# Patient Record
Sex: Female | Born: 1965 | Race: Black or African American | Hispanic: No | Marital: Married | Smoking: Never smoker
Health system: Southern US, Community
[De-identification: ages and names within clinical notes are randomized; demographics above are authoritative.]

## PROBLEM LIST (undated history)

## (undated) DIAGNOSIS — E876 Hypokalemia: Secondary | ICD-10-CM

## (undated) DIAGNOSIS — A419 Sepsis, unspecified organism: Secondary | ICD-10-CM

## (undated) DIAGNOSIS — H547 Unspecified visual loss: Secondary | ICD-10-CM

## (undated) DIAGNOSIS — M797 Fibromyalgia: Secondary | ICD-10-CM

## (undated) DIAGNOSIS — I509 Heart failure, unspecified: Secondary | ICD-10-CM

## (undated) DIAGNOSIS — M729 Fibroblastic disorder, unspecified: Secondary | ICD-10-CM

## (undated) DIAGNOSIS — L0390 Cellulitis, unspecified: Secondary | ICD-10-CM

## (undated) DIAGNOSIS — I10 Essential (primary) hypertension: Secondary | ICD-10-CM

## (undated) HISTORY — DX: Cellulitis, unspecified: L03.90

## (undated) HISTORY — DX: Hypokalemia: E87.6

## (undated) HISTORY — DX: Fibroblastic disorder, unspecified: M72.9

## (undated) HISTORY — DX: Sepsis, unspecified organism: A41.9

## (undated) HISTORY — DX: Heart failure, unspecified: I50.9

## (undated) HISTORY — DX: Fibromyalgia: M79.7

---

## 2013-02-03 ENCOUNTER — Emergency Department (HOSPITAL_COMMUNITY)
Admission: EM | Admit: 2013-02-03 | Discharge: 2013-02-04 | Disposition: A | Discharge status: Home / Self Care | Payer: Medicare Other | Attending: Emergency Medicine | Admitting: Emergency Medicine

## 2013-02-03 ENCOUNTER — Encounter (HOSPITAL_COMMUNITY): Payer: Self-pay | Admitting: Emergency Medicine

## 2013-02-03 DIAGNOSIS — Z79899 Other long term (current) drug therapy: Secondary | ICD-10-CM | POA: Insufficient documentation

## 2013-02-03 DIAGNOSIS — N390 Urinary tract infection, site not specified: Secondary | ICD-10-CM | POA: Insufficient documentation

## 2013-02-03 DIAGNOSIS — Z3202 Encounter for pregnancy test, result negative: Secondary | ICD-10-CM | POA: Insufficient documentation

## 2013-02-03 DIAGNOSIS — I10 Essential (primary) hypertension: Secondary | ICD-10-CM | POA: Insufficient documentation

## 2013-02-03 DIAGNOSIS — K5900 Constipation, unspecified: Secondary | ICD-10-CM | POA: Insufficient documentation

## 2013-02-03 DIAGNOSIS — H543 Unqualified visual loss, both eyes: Secondary | ICD-10-CM | POA: Insufficient documentation

## 2013-02-03 HISTORY — DX: Essential (primary) hypertension: I10

## 2013-02-03 HISTORY — DX: Unspecified visual loss: H54.7

## 2013-02-03 MED ORDER — HYDROCODONE-ACETAMINOPHEN 5-325 MG PO TABS
1.0000 | ORAL_TABLET | Freq: Once | ORAL | Status: AC
  Administered 2013-02-04: 1 via ORAL
  Filled 2013-02-03: qty 1

## 2013-02-03 MED ORDER — ONDANSETRON 4 MG PO TBDP
4.0000 mg | ORAL_TABLET | Freq: Once | ORAL | Status: DC
  Filled 2013-02-03 (×2): qty 1

## 2013-02-03 NOTE — ED Notes (Signed)
Bed: WA06 Expected date:  Expected time:  Means of arrival:  Comments: EMS 47yo F abd pain

## 2013-02-03 NOTE — ED Notes (Signed)
Sudden onset of abdominal pain today. LLQ and RLQ pain 8/10 sharp and cramping. Bowel sound present, no distention noted, denied the following: diarrhea, vomiting, nausea, dysuria. LBM today. Had church picnic at 3pm with a variety of foods.

## 2013-02-04 ENCOUNTER — Emergency Department (HOSPITAL_COMMUNITY): Payer: Medicare Other

## 2013-02-04 LAB — URINALYSIS, ROUTINE W REFLEX MICROSCOPIC
Glucose, UA: NEGATIVE mg/dL
Protein, ur: NEGATIVE mg/dL
pH: 7 (ref 5.0–8.0)

## 2013-02-04 LAB — CBC WITH DIFFERENTIAL/PLATELET
Basophils Relative: 0 % (ref 0–1)
Eosinophils Absolute: 0.2 10*3/uL (ref 0.0–0.7)
Eosinophils Relative: 2 % (ref 0–5)
HCT: 36.4 % (ref 36.0–46.0)
Hemoglobin: 11.8 g/dL — ABNORMAL LOW (ref 12.0–15.0)
MCH: 27.9 pg (ref 26.0–34.0)
MCHC: 32.4 g/dL (ref 30.0–36.0)
MCV: 86.1 fL (ref 78.0–100.0)
Monocytes Absolute: 0.7 10*3/uL (ref 0.1–1.0)
Monocytes Relative: 7 % (ref 3–12)
RBC: 4.23 MIL/uL (ref 3.87–5.11)

## 2013-02-04 LAB — COMPREHENSIVE METABOLIC PANEL
Albumin: 3.7 g/dL (ref 3.5–5.2)
BUN: 14 mg/dL (ref 6–23)
Calcium: 9.1 mg/dL (ref 8.4–10.5)
Creatinine, Ser: 0.83 mg/dL (ref 0.50–1.10)
Total Bilirubin: 0.3 mg/dL (ref 0.3–1.2)
Total Protein: 7.7 g/dL (ref 6.0–8.3)

## 2013-02-04 LAB — URINE MICROSCOPIC-ADD ON

## 2013-02-04 LAB — POCT PREGNANCY, URINE: Preg Test, Ur: NEGATIVE

## 2013-02-04 MED ORDER — MAGNESIUM CITRATE PO SOLN: 1.0000 | Freq: Once | ORAL | Status: DC

## 2013-02-04 MED ORDER — SULFAMETHOXAZOLE-TRIMETHOPRIM 800-160 MG PO TABS: 1.0000 | ORAL_TABLET | Freq: Two times a day (BID) | ORAL | Status: DC

## 2013-02-04 NOTE — ED Provider Notes (Addendum)
CSN: [id number]     Arrival date & time 02/03/13  2329 History   First MD Initiated Contact with Patient 02/03/13 2342     Chief Complaint  Patient presents with  . Abdominal Pain   (Consider location/radiation/quality/duration/timing/severity/associated sxs/prior Treatment) Patient is a 47 y.o. female presenting with abdominal pain. The history is provided by the patient and the spouse.  Abdominal Pain Pain location:  LLQ and RLQ Pain quality: cramping   Pain radiates to:  Does not radiate Pain severity:  Moderate Onset quality:  Sudden Duration:  1 hour Timing:  Intermittent Progression:  Improving Chronicity:  New Context comment:  Started after having a hard bowel movement Relieved by:  None tried Worsened by:  Nothing tried Ineffective treatments:  None tried Associated symptoms: no anorexia, no chest pain, no cough, no diarrhea, no dysuria, no nausea, no shortness of breath and no vomiting   Risk factors: has not had multiple surgeries, no NSAID use and not pregnant     Past Medical History  Diagnosis Date  . Hypertension   . Blind    No past surgical history on file. No family history on file. History  Substance Use Topics  . Smoking status: Never Smoker   . Smokeless tobacco: Not on file  . Alcohol Use: No   OB History   Grav Para Term Preterm Abortions TAB SAB Ect Mult Living                 Review of Systems  Respiratory: Negative for cough and shortness of breath.   Cardiovascular: Negative for chest pain.  Gastrointestinal: Positive for abdominal pain. Negative for nausea, vomiting, diarrhea and anorexia.  Genitourinary: Negative for dysuria.  All other systems reviewed and are negative.    Allergies  Percocet and Vicodin  Home Medications   Current Outpatient Rx  Name  Route  Sig  Dispense  Refill  . amLODipine (NORVASC) 5 MG tablet   Oral   Take 5 mg by mouth every morning.          BP 144/89  Pulse 78  Temp(Src) 98.2 F (36.8 C)  (Oral)  Resp 24  SpO2 100%  LMP 01/14/2013 Physical Exam  Nursing note and vitals reviewed. Constitutional: She is oriented to person, place, and time. She appears well-developed and well-nourished. No distress.  HENT:  Head: Normocephalic and atraumatic.  Mouth/Throat: Oropharynx is clear and moist.  Eyes: Conjunctivae and EOM are normal. Pupils are equal, round, and reactive to light.  Neck: Normal range of motion. Neck supple.  Cardiovascular: Normal rate, regular rhythm and intact distal pulses.   No murmur heard. Pulmonary/Chest: Effort normal and breath sounds normal. No respiratory distress. She has no wheezes. She has no rales.  Abdominal: Soft. Bowel sounds are normal. She exhibits no distension. There is tenderness in the right lower quadrant and left lower quadrant. There is no rebound, no guarding and no CVA tenderness.    Musculoskeletal: Normal range of motion. She exhibits no edema and no tenderness.  Neurological: She is alert and oriented to person, place, and time.  Skin: Skin is warm and dry. No rash noted. No erythema.  Psychiatric: She has a normal mood and affect. Her behavior is normal.    ED Course  Procedures (including critical care time) Labs Review Labs Reviewed  CBC WITH DIFFERENTIAL - Abnormal; Notable for the following:    Hemoglobin 11.8 (*)    All other components within normal limits  COMPREHENSIVE METABOLIC PANEL -  Abnormal; Notable for the following:    Glucose, Bld 103 (*)    GFR calc non Af Amer 83 (*)    All other components within normal limits  URINALYSIS, ROUTINE W REFLEX MICROSCOPIC - Abnormal; Notable for the following:    APPearance CLOUDY (*)    Leukocytes, UA LARGE (*)    All other components within normal limits  URINE MICROSCOPIC-ADD ON - Abnormal; Notable for the following:    Squamous Epithelial / LPF FEW (*)    Bacteria, UA MANY (*)    All other components within normal limits  URINE CULTURE  POCT PREGNANCY, URINE    Imaging Review Dg Abd Acute W/chest  02/04/2013   CLINICAL DATA:  Abdominal pain, cramping and constipation.  EXAM: ACUTE ABDOMEN SERIES (ABDOMEN 2 VIEW & CHEST 1 VIEW)  COMPARISON:  None available for comparison at time of study interpretation.  FINDINGS: Cardiomediastinal silhouette is unremarkable. Lungs are clear. No pneumothorax. Soft tissue planes and included osseous structures are nonsuspicious.  Mildly dilated gas-filled large bowel in the left upper quadrant with a moderate amount of ascending large bowel stool. Paucity of bowel gas in the distal colon or rectum. Curvilinear dense material projects lateral to the left iliac bone and may be external to the patient. Phleboliths in the pelvis.  IMPRESSION: No acute cardiopulmonary process.  Moderate amount of ascending large bowel stool, with gas distended proximal descending colon, no bowel obstruction.  Curvilinear dense material projects lateral to the left iliac bone, recommend direct inspection.   Electronically Signed   By: Awilda Metro   On: 02/04/2013 00:33    EKG Interpretation   None       MDM   1. Constipation   2. UTI (lower urinary tract infection)     Patient presenting with abdominal cramping that started approximately one hour prior to arrival after she had a bowel movement. Patient states she suffers from constipation but does not usually have abdominal pain like this. She denies nausea, vomiting, diarrhea or dysuria. Husband states she suffered with constipation for a long time.  On exam patient has positive bowel sounds and only mild tenderness in the bilateral lower quadrants. No findings suggestive for appendicitis, cholecystitis or pancreatitis. Acute abdominal film shows a moderate amount of descending large stool burden with a gas distended proximally which is most likely the cause of her cramping and pain. There is no sign of bowel obstruction. Also CBC and CMP are within normal limits. UA is consistent with  UTI and patient will be given antibiotics. Also discussed with patient using MiraLax, magnesium citrate, Colace or Dulcolax to improve her constipation.    Gwyneth Sprout, MD 02/04/13 1610  Gwyneth Sprout, MD 02/04/13 [id number]

## 2013-02-04 NOTE — ED Notes (Signed)
Pt is difficult stick, pt will only allow [place] to stick her in her [facility]. Unable to draw within 2 sticks so pt refuses any further sticks. Water engineer aware.

## 2013-02-05 LAB — URINE CULTURE: Colony Count: 40000

## 2013-10-01 ENCOUNTER — Emergency Department (HOSPITAL_COMMUNITY)
Admission: EM | Admit: 2013-10-01 | Discharge: 2013-10-01 | Disposition: A | Discharge status: Home / Self Care | Payer: Medicare Other | Attending: Emergency Medicine | Admitting: Emergency Medicine

## 2013-10-01 ENCOUNTER — Emergency Department (HOSPITAL_COMMUNITY): Payer: Medicare Other

## 2013-10-01 ENCOUNTER — Encounter (HOSPITAL_COMMUNITY): Payer: Self-pay | Admitting: Emergency Medicine

## 2013-10-01 DIAGNOSIS — S4980XA Other specified injuries of shoulder and upper arm, unspecified arm, initial encounter: Secondary | ICD-10-CM | POA: Diagnosis present

## 2013-10-01 DIAGNOSIS — Z79899 Other long term (current) drug therapy: Secondary | ICD-10-CM | POA: Insufficient documentation

## 2013-10-01 DIAGNOSIS — H543 Unqualified visual loss, both eyes: Secondary | ICD-10-CM | POA: Insufficient documentation

## 2013-10-01 DIAGNOSIS — M25519 Pain in unspecified shoulder: Secondary | ICD-10-CM | POA: Insufficient documentation

## 2013-10-01 DIAGNOSIS — I10 Essential (primary) hypertension: Secondary | ICD-10-CM | POA: Insufficient documentation

## 2013-10-01 DIAGNOSIS — S46909A Unspecified injury of unspecified muscle, fascia and tendon at shoulder and upper arm level, unspecified arm, initial encounter: Secondary | ICD-10-CM | POA: Insufficient documentation

## 2013-10-01 DIAGNOSIS — R52 Pain, unspecified: Secondary | ICD-10-CM | POA: Diagnosis not present

## 2013-10-01 DIAGNOSIS — M25511 Pain in right shoulder: Secondary | ICD-10-CM

## 2013-10-01 MED ORDER — MELOXICAM 15 MG PO TABS: 15.0000 mg | ORAL_TABLET | Freq: Every day | ORAL | Status: DC

## 2013-10-01 MED ORDER — HYDROCODONE-ACETAMINOPHEN 5-325 MG PO TABS
1.0000 | ORAL_TABLET | Freq: Once | ORAL | Status: AC
  Administered 2013-10-01: 1 via ORAL
  Filled 2013-10-01: qty 1

## 2013-10-01 MED ORDER — ONDANSETRON 4 MG PO TBDP
4.0000 mg | ORAL_TABLET | Freq: Once | ORAL | Status: AC
  Administered 2013-10-01: 4 mg via ORAL
  Filled 2013-10-01: qty 1
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
## 2013-10-01 NOTE — Discharge Instructions (Signed)
Your x-rays did not show any broken bones or other concerning injury in your shoulder. Use rest, ice, compression and elevation to reduce pain and swelling. Followup with your doctor for continued evaluation and treatment.   Shoulder Pain The shoulder is the joint that connects your arm to your body. Muscles and band-like tissues that connect bones to muscles (tendons) hold the joint together. Shoulder pain is felt if an injury or medical problem affects one or more parts of the shoulder. HOME CARE   Put ice on the sore area.  Put ice in a plastic bag.  Place a towel between your skin and the bag.  Leave the ice on for 15-20 minutes, 03-04 times a day for the first 2 days.  Stop using cold packs if they do not help with the pain.  If you were given something to keep your shoulder from moving (sling; shoulder immobilizer), wear it as told. Only take it off to shower or bathe.  Move your arm as little as possible, but keep your hand moving to prevent puffiness (swelling).  Squeeze a soft ball or foam pad as much as possible to help prevent swelling.  Take medicine as told by your doctor. GET HELP IF:  You have progressing new pain in your arm, hand, or fingers.  Your hand or fingers get cold.  Your medicine does not help lessen your pain. GET HELP RIGHT AWAY IF:   Your arm, hand, or fingers are numb or tingling.  Your arm, hand, or fingers are puffy (swollen), painful, or turn white or blue. MAKE SURE YOU:   Understand these instructions.  Will watch your condition.  Will get help right away if you are not doing well or get worse. Document Released: 07/27/2007 Document Revised: 06/24/2013 Document Reviewed: 08/22/2011 [facility] Patient Information 2015 [place], [place]. This information is not intended to replace advice given to you by your health care provider. Make sure you discuss any questions you have with your health care provider.

## 2013-10-08 NOTE — ED Provider Notes (Signed)
Medical screening examination/treatment/procedure(s) were performed by non-physician practitioner and as supervising physician I was immediately available for consultation/collaboration.     Ashlin Kreps E Janaa Acero, MD 10/08/13 0935 

## 2015-02-27 ENCOUNTER — Other Ambulatory Visit: Payer: Self-pay

## 2015-02-27 DIAGNOSIS — Z1231 Encounter for screening mammogram for malignant neoplasm of breast: Secondary | ICD-10-CM

## 2015-03-18 ENCOUNTER — Ambulatory Visit
Admission: RE | Admit: 2015-03-18 | Discharge: 2015-03-18 | Disposition: A | Discharge status: Home / Self Care | Payer: Medicare Other | Source: Ambulatory Visit

## 2015-03-18 DIAGNOSIS — Z1231 Encounter for screening mammogram for malignant neoplasm of breast: Secondary | ICD-10-CM

## 2015-03-25 ENCOUNTER — Other Ambulatory Visit: Payer: Self-pay | Admitting: Internal Medicine

## 2015-03-25 DIAGNOSIS — R928 Other abnormal and inconclusive findings on diagnostic imaging of breast: Secondary | ICD-10-CM

## 2015-03-31 ENCOUNTER — Other Ambulatory Visit: Payer: Medicare Other

## 2015-04-02 ENCOUNTER — Other Ambulatory Visit: Payer: Self-pay | Admitting: Internal Medicine

## 2015-04-02 DIAGNOSIS — R928 Other abnormal and inconclusive findings on diagnostic imaging of breast: Secondary | ICD-10-CM

## 2015-04-02 DIAGNOSIS — N644 Mastodynia: Secondary | ICD-10-CM

## 2015-04-09 ENCOUNTER — Other Ambulatory Visit: Payer: Self-pay | Admitting: Internal Medicine

## 2015-04-09 ENCOUNTER — Ambulatory Visit
Admission: RE | Admit: 2015-04-09 | Discharge: 2015-04-09 | Disposition: A | Discharge status: Home / Self Care | Payer: Medicare Other | Source: Ambulatory Visit | Attending: Internal Medicine | Admitting: Internal Medicine

## 2015-04-09 DIAGNOSIS — R928 Other abnormal and inconclusive findings on diagnostic imaging of breast: Secondary | ICD-10-CM

## 2015-04-09 DIAGNOSIS — N644 Mastodynia: Secondary | ICD-10-CM

## 2015-04-16 ENCOUNTER — Ambulatory Visit
Admission: RE | Admit: 2015-04-16 | Discharge: 2015-04-16 | Disposition: A | Discharge status: Home / Self Care | Payer: Medicare Other | Source: Ambulatory Visit | Attending: Internal Medicine | Admitting: Internal Medicine

## 2015-04-16 DIAGNOSIS — R928 Other abnormal and inconclusive findings on diagnostic imaging of breast: Secondary | ICD-10-CM

## 2015-08-08 ENCOUNTER — Encounter (HOSPITAL_COMMUNITY): Payer: Self-pay | Admitting: Emergency Medicine

## 2015-08-08 ENCOUNTER — Emergency Department (HOSPITAL_COMMUNITY)
Admission: EM | Admit: 2015-08-08 | Discharge: 2015-08-08 | Disposition: A | Discharge status: Home / Self Care | Payer: Medicare Other | Attending: Emergency Medicine | Admitting: Emergency Medicine

## 2015-08-08 ENCOUNTER — Emergency Department (HOSPITAL_COMMUNITY): Payer: Medicare Other

## 2015-08-08 DIAGNOSIS — M79672 Pain in left foot: Secondary | ICD-10-CM | POA: Insufficient documentation

## 2015-08-08 DIAGNOSIS — I10 Essential (primary) hypertension: Secondary | ICD-10-CM | POA: Insufficient documentation

## 2015-08-08 MED ORDER — IBUPROFEN 600 MG PO TABS: 600.0000 mg | ORAL_TABLET | Freq: Four times a day (QID) | ORAL | Status: DC | PRN

## 2015-08-08 NOTE — ED Provider Notes (Signed)
CSN: [id number]     Arrival date & time 08/08/15  0037 History   First MD Initiated Contact with Patient 08/08/15 [phone]     Chief Complaint  Patient presents with  . Foot Pain     (Consider location/radiation/quality/duration/timing/severity/associated sxs/prior Treatment) HPI Comments: Patient with a history of HTN, blindness, presents with left foot pain without known injury for the last 2 days. She can ambulate but reports it causes progressively worsening pain.   Patient is a 50 y.o. female presenting with lower extremity pain. The history is provided by the patient and a relative. No language interpreter was used.  Foot Pain This is a new problem. The current episode started yesterday. The problem occurs constantly. Pertinent negatives include no numbness.    Past Medical History  Diagnosis Date  . Hypertension   . Blind    History reviewed. No pertinent past surgical history. No family history on file. Social History  Substance Use Topics  . Smoking status: Never Smoker   . Smokeless tobacco: Never Used  . Alcohol Use: No   OB History    No data available     Review of Systems  Musculoskeletal:       See HPI.  Skin: Negative.  Negative for color change and wound.  Neurological: Negative.  Negative for numbness.      Allergies  Percocet and Vicodin  Home Medications   Prior to Admission medications   Medication Sig Start Date End Date Taking? Authorizing Provider  amLODipine (NORVASC) 5 MG tablet Take 5 mg by mouth every morning.    Historical Provider, MD  gabapentin (NEURONTIN) 300 MG capsule Take 300 mg by mouth 3 (three) times daily.    Historical Provider, MD  meloxicam (MOBIC) 15 MG tablet Take 1 tablet (15 mg total) by mouth daily. 10/01/13   Peter Dammen, PA-C   BP 140/88 mmHg  Pulse 71  Temp(Src) 98 F (36.7 C) (Oral)  Resp 20  SpO2 100%  LMP 07/11/2015 Physical Exam  Constitutional: She is oriented to person, place, and time. She appears  well-developed and well-nourished.  Neck: Normal range of motion.  Cardiovascular: Intact distal pulses.   Pulmonary/Chest: Effort normal.  Musculoskeletal:  Left foot and ankle are unremarkable in appearance. Tender medially. No bony deformity. Joint stable.   Neurological: She is alert and oriented to person, place, and time.  Skin: Skin is warm and dry.    ED Course  Procedures (including critical care time) Labs Review Labs Reviewed - No data to display  Imaging Review Dg Ankle Complete Left  08/08/2015  CLINICAL DATA:  Left foot and ankle pain for 1 day.  No injury. EXAM: LEFT ANKLE COMPLETE - 3+ VIEW COMPARISON:  None. FINDINGS: There is no evidence of fracture, dislocation, or joint effusion. There is no evidence of arthropathy or other focal bone abnormality. Soft tissues are unremarkable. Calcified phleboliths. IMPRESSION: No acute bony abnormalities. Electronically Signed   By: Burman NievesWilliam  Stevens M.D.   On: 08/08/2015 03:07   I have personally reviewed and evaluated these images and lab results as part of my medical decision-making.   EKG Interpretation None      MDM   Final diagnoses:  None    1. Left foot pain  Imaging negative for fracture. No evidence of infection. Stable for discharge home.    Elpidio AnisShari Elliot Simoneaux, PA-C 08/08/15 0423  Geoffery Lyonsouglas Delo, MD 08/08/15 [phone]

## 2015-08-08 NOTE — Discharge Instructions (Signed)
Cryotherapy °Cryotherapy means treatment with cold. Ice or gel packs can be used to reduce both pain and swelling. Ice is the most helpful within the first 24 to 48 hours after an injury or flare-up from overusing a muscle or joint. Sprains, strains, spasms, burning pain, shooting pain, and aches can all be eased with ice. Ice can also be used when recovering from surgery. Ice is effective, has very few side effects, and is safe for most people to use. °PRECAUTIONS  °Ice is not a safe treatment option for people with: °· Raynaud phenomenon. This is a condition affecting small blood vessels in the extremities. Exposure to cold may cause your problems to return. °· Cold hypersensitivity. There are many forms of cold hypersensitivity, including: °¨ Cold urticaria. Red, itchy hives appear on the skin when the tissues begin to warm after being iced. °¨ Cold erythema. This is a red, itchy rash caused by exposure to cold. °¨ Cold hemoglobinuria. Red blood cells break down when the tissues begin to warm after being iced. The hemoglobin that carry oxygen are passed into the urine because they cannot combine with blood proteins fast enough. °· Numbness or altered sensitivity in the area being iced. °If you have any of the following conditions, do not use ice until you have discussed cryotherapy with your caregiver: °· Heart conditions, such as arrhythmia, angina, or chronic heart disease. °· High blood pressure. °· Healing wounds or open skin in the area being iced. °· Current infections. °· Rheumatoid arthritis. °· Poor circulation. °· Diabetes. °Ice slows the blood flow in the region it is applied. This is beneficial when trying to stop inflamed tissues from spreading irritating chemicals to surrounding tissues. However, if you expose your skin to cold temperatures for too long or without the proper protection, you can damage your skin or nerves. Watch for signs of skin damage due to cold. °HOME CARE INSTRUCTIONS °Follow  these tips to use ice and cold packs safely. °· Place a dry or damp towel between the ice and skin. A damp towel will cool the skin more quickly, so you may need to shorten the time that the ice is used. °· For a more rapid response, add gentle compression to the ice. °· Ice for no more than 10 to 20 minutes at a time. The bonier the area you are icing, the less time it will take to get the benefits of ice. °· Check your skin after 5 minutes to make sure there are no signs of a poor response to cold or skin damage. °· Rest 20 minutes or more between uses. °· Once your skin is numb, you can end your treatment. You can test numbness by very lightly touching your skin. The touch should be so light that you do not see the skin dimple from the pressure of your fingertip. When using ice, most people will feel these normal sensations in this order: cold, burning, aching, and numbness. °· Do not use ice on someone who cannot communicate their responses to pain, such as small children or people with dementia. °HOW TO MAKE AN ICE PACK °Ice packs are the most common way to use ice therapy. Other methods include ice massage, ice baths, and cryosprays. Muscle creams that cause a cold, tingly feeling do not offer the same benefits that ice offers and should not be used as a substitute unless recommended by your caregiver. °To make an ice pack, do one of the following: °· Place crushed ice or a   bag of frozen vegetables in a sealable plastic bag. Squeeze out the excess air. Place this bag inside another plastic bag. Slide the bag into a pillowcase or place a damp towel between your skin and the bag. °· Mix 3 parts water with 1 part rubbing alcohol. Freeze the mixture in a sealable plastic bag. When you remove the mixture from the freezer, it will be slushy. Squeeze out the excess air. Place this bag inside another plastic bag. Slide the bag into a pillowcase or place a damp towel between your skin and the bag. °SEEK MEDICAL CARE  IF: °· You develop white spots on your skin. This may give the skin a blotchy (mottled) appearance. °· Your skin turns blue or pale. °· Your skin becomes waxy or hard. °· Your swelling gets worse. °MAKE SURE YOU:  °· Understand these instructions. °· Will watch your condition. °· Will get help right away if you are not doing well or get worse. °  °This information is not intended to replace advice given to you by your health care provider. Make sure you discuss any questions you have with your health care provider. °  °Document Released: 10/04/2010 Document Revised: 02/28/2014 Document Reviewed: 10/04/2010 °Elsevier Interactive Patient Education ©2016 [employer]vier Inc. ° °Musculoskeletal Pain °Musculoskeletal pain is muscle and boney aches and pains. These pains can occur in any part of the body. Your caregiver may treat you without knowing the cause of the pain. They may treat you if blood or urine tests, X-rays, and other tests were normal.  °CAUSES °There is often not a definite cause or reason for these pains. These pains may be caused by a type of germ (virus). The discomfort may also come from overuse. Overuse includes working out too hard when your body is not fit. Boney aches also come from weather changes. Bone is sensitive to atmospheric pressure changes. °HOME CARE INSTRUCTIONS  °· Ask when your test results will be ready. Make sure you get your test results. °· Only take over-the-counter or prescription medicines for pain, discomfort, or fever as directed by your caregiver. If you were given medications for your condition, do not drive, operate machinery or power tools, or sign legal documents for 24 hours. Do not drink alcohol. Do not take sleeping pills or other medications that may interfere with treatment. °· Continue all activities unless the activities cause more pain. When the pain lessens, slowly resume normal activities. Gradually increase the intensity and duration of the activities or  exercise. °· During periods of severe pain, bed rest may be helpful. Lay or sit in any position that is comfortable. °· Putting ice on the injured area. °¨ Put ice in a bag. °¨ Place a towel between your skin and the bag. °¨ Leave the ice on for 15 to 20 minutes, 3 to 4 times a day. °· Follow up with your caregiver for continued problems and no reason can be found for the pain. If the pain becomes worse or does not go away, it may be necessary to repeat tests or do additional testing. Your caregiver may need to look further for a possible cause. °SEEK IMMEDIATE MEDICAL CARE IF: °· You have pain that is getting worse and is not relieved by medications. °· You develop chest pain that is associated with shortness or breath, sweating, feeling sick to your stomach (nauseous), or throw up (vomit). °· Your pain becomes localized to the abdomen. °· You develop any new symptoms that seem different or that concern   you. °MAKE SURE YOU:  °· Understand these instructions. °· Will watch your condition. °· Will get help right away if you are not doing well or get worse. °  °This information is not intended to replace advice given to you by your health care provider. Make sure you discuss any questions you have with your health care provider. °  °Document Released: 02/07/2005 Document Revised: 05/02/2011 Document Reviewed: 10/12/2012 °Elsevier Interactive Patient Education ©2016 Elsevier Inc. ° °

## 2015-08-08 NOTE — ED Notes (Signed)
Patient here with complaints of left foot pain. Denis injury. Took Gabapentin without relief.

## 2015-09-03 ENCOUNTER — Encounter: Payer: Self-pay | Admitting: Certified Nurse Midwife

## 2015-09-03 ENCOUNTER — Ambulatory Visit (INDEPENDENT_AMBULATORY_CARE_PROVIDER_SITE_OTHER): Payer: Medicare Other | Admitting: Certified Nurse Midwife

## 2015-09-03 VITALS — BP 144/82 | HR 80 | Temp 98.0°F | Ht 62.0 in | Wt 221.0 lb

## 2015-09-03 DIAGNOSIS — Z01419 Encounter for gynecological examination (general) (routine) without abnormal findings: Secondary | ICD-10-CM | POA: Insufficient documentation

## 2015-09-03 DIAGNOSIS — N924 Excessive bleeding in the premenopausal period: Secondary | ICD-10-CM | POA: Diagnosis not present

## 2015-09-03 DIAGNOSIS — Z3009 Encounter for other general counseling and advice on contraception: Secondary | ICD-10-CM

## 2015-09-03 DIAGNOSIS — H547 Unspecified visual loss: Secondary | ICD-10-CM

## 2015-09-03 NOTE — Progress Notes (Signed)
Patient ID: Kirsten Avery, female   DOB: November 08, 1965, 50 y.o.   MRN: [id number]   Subjective:        Kirsten Avery is a 50 y.o. female here for a routine exam.  Current complaints:  No current sexual activity, but planned sexual activity in the future.  Tried depo, with success, but does want to have to come back every 3 months.  Does not want IUD.  Does not want OCP.  Does not want to do NuvaRing.  Reports heavy flow with periods.  Periods lasting 3-6 days.   Uses tampon and pad.    Primary provider is [facility], peformed her PAP a couple of weeks ago (Normal).  Moved recently.  Walks frequently.  Going to setup treadmill at the house.  Desires to exercise.  Attempting healthy diet.  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  Mother: multiple myeloma.  Denies any family history of uterine, ovarian or breast CA.  Reported preeclampsia during last pregnancy.  Reported fibromyalgia.  Reports difficulty with BMs, but BMs usually soft.  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  Personal health questionnaire:  Is patient Ashkenazi Jewish, have a family history of breast and/or ovarian cancer: no Is there a family history of uterine cancer diagnosed at age < 73, gastrointestinal cancer, urinary tract cancer, family member who is a Field seismologist syndrome-associated carrier: no Is the patient overweight and hypertensive, family history of diabetes, personal history of gestational diabetes, preeclampsia or PCOS: no Is patient over 36, have PCOS,  family history of premature CHD under age 49, diabetes, smoke, have hypertension or peripheral artery disease:  no At any time, has a partner hit, kicked or otherwise hurt or frightened you?: no Over the past 2 weeks, have you felt down, depressed or hopeless?: no Over the past 2 weeks, have you felt little interest or pleasure in doing things?:no   Gynecologic  History Patient's last menstrual period was 08/10/2015 (approximate). Contraception: none Last Pap: A couple of weeks ago. Results were: normal Last mammogram: February 2017. Results were: normal  Obstetric History OB History  Gravida Para Term Preterm AB SAB TAB Ectopic Multiple Living  '7 5 5  2 2    5    ' # Outcome Date GA Lbr Len/2nd Weight Sex Delivery Anes PTL Lv  7 SAB           6 SAB           5 Term           4 Term           3 Term           2 Term           1 Term               Past Medical History  Diagnosis Date  . Hypertension   . Blind   . Fibromyalgia     No past surgical history on file.   Current outpatient prescriptions:  .  gabapentin (NEURONTIN) 300 MG capsule, Take 300 mg by mouth 3 (three) times daily., Disp: , Rfl:  .  ibuprofen (ADVIL,MOTRIN) 600 MG tablet, Take 1 tablet (600 mg total) by mouth every 6 (six) hours as needed., Disp: 30 tablet, Rfl: 0 .  lisinopril (PRINIVIL,ZESTRIL) 10 MG tablet, Take 10 mg by mouth daily., Disp: , Rfl:  Allergies  Allergen Reactions  . Percocet [Oxycodone-Acetaminophen] Nausea  Only    Tolerated with food  . Vicodin [Hydrocodone-Acetaminophen] Nausea Only    Tolerated with food    Social History  Substance Use Topics  . Smoking status: Never Smoker   . Smokeless tobacco: Never Used  . Alcohol Use: No    No family history on file.    Review of Systems  Constitutional: negative for fatigue and weight loss Respiratory: negative for cough and wheezing Cardiovascular: negative for chest pain, fatigue and palpitations Gastrointestinal: negative for abdominal pain and change in bowel habits Musculoskeletal:negative for myalgias Neurological: negative for gait problems and tremors Behavioral/Psych: negative for abusive relationship, depression Endocrine: negative for temperature intolerance   Genitourinary:negative for abnormal menstrual periods, genital lesions, hot flashes, sexual problems and vaginal  discharge Integument/breast: negative for breast lump, breast tenderness, nipple discharge and skin lesion(s)    Objective:       BP 144/82 mmHg  Pulse 80  Temp(Src) 98 F (36.7 C)  Ht '5\' 2"'  (1.575 m)  Wt 221 lb (100.245 kg)  BMI 40.41 kg/m2  LMP 08/10/2015 (Approximate) General:   alert  Skin:   no rash or abnormalities  Lungs:   clear to auscultation bilaterally  Heart:   regular rate and rhythm, S1, S2 normal, no murmur, click, rub or gallop  Breasts:   normal without suspicious masses, skin or nipple changes or axillary nodes  Abdomen:  normal findings: no organomegaly, soft, non-tender and no hernia  Pelvis:  External genitalia: normal general appearance Urinary system: urethral meatus normal and bladder without fullness, nontender Vaginal: normal without tenderness, induration or masses Cervix: normal appearance Adnexa: normal bimanual exam Uterus: anteverted and non-tender, normal size   Lab Review Urine pregnancy test Labs reviewed no Radiologic studies reviewed no  50% of 30 min visit spent on counseling and coordination of care.   Assessment:    Healthy female exam.    Plan:    Education reviewed: depression evaluation, low fat, low cholesterol diet, safe sex/STD prevention, self breast exams and weight bearing exercise. Contraception: Nexplanon. Follow up in: 1 year.  Follow-up for Nexplanon insertion. Meds ordered this encounter  Medications  . lisinopril (PRINIVIL,ZESTRIL) 10 MG tablet    Sig: Take 10 mg by mouth daily.   No orders of the defined types were placed in this encounter.   Need to obtain previous records Possible management options include: contraception plans  Follow up as needed.

## 2015-09-08 ENCOUNTER — Ambulatory Visit (INDEPENDENT_AMBULATORY_CARE_PROVIDER_SITE_OTHER): Payer: Medicare Other | Admitting: Certified Nurse Midwife

## 2015-09-08 VITALS — BP 130/86 | HR 73 | Temp 97.5°F | Wt 220.0 lb

## 2015-09-08 DIAGNOSIS — Z30017 Encounter for initial prescription of implantable subdermal contraceptive: Secondary | ICD-10-CM | POA: Insufficient documentation

## 2015-09-08 DIAGNOSIS — Z30019 Encounter for initial prescription of contraceptives, unspecified: Secondary | ICD-10-CM

## 2015-09-08 DIAGNOSIS — N939 Abnormal uterine and vaginal bleeding, unspecified: Secondary | ICD-10-CM

## 2015-09-08 DIAGNOSIS — Z3049 Encounter for surveillance of other contraceptives: Secondary | ICD-10-CM

## 2015-09-08 DIAGNOSIS — Z3202 Encounter for pregnancy test, result negative: Secondary | ICD-10-CM

## 2015-09-08 DIAGNOSIS — Z01812 Encounter for preprocedural laboratory examination: Secondary | ICD-10-CM

## 2015-09-08 LAB — POCT URINE PREGNANCY: PREG TEST UR: NEGATIVE

## 2015-09-08 NOTE — Progress Notes (Signed)
Patient ID: Kirsten DelaineBerneta Rena Avery, female   DOB: 1965/04/03, 50 y.o.   MRN: [id number]  Nexplanon Procedure Note   PRE-OP DIAGNOSIS: desired long-term, reversible contraception  POST-OP DIAGNOSIS: Same  PROCEDURE: Nexplanon  placement Performing Provider: Orvilla Cornwallachelle Zamari Vea CNM   Patient education prior to procedure, explained risk, benefits of Nexplanon, reviewed alternative options. Patient reported understanding. Gave consent to continue with procedure.   PROCEDURE:  Pregnancy Text :  Negative Site (check):      left arm         Sterile Preparation:   Betadinex3 Lot # [id number] Expiration Date 09/2017  Insertion site was selected 8 - 10 cm from medial epicondyle and marked along with guiding site using sterile marker. Procedure area was prepped and draped in a sterile fashion. 1% Lidocaine 1.5 ml given prior to procedure. Nexplanon  was inserted subcutaneously.Needle was removed from the insertion site. Nexplanon capsule was palpated by provider and patient to assure satisfactory placement. And a bandage applied and the arm was wrapped with gauze bandage.     Followup: The patient tolerated the procedure well without complications.  Instructions:  The patient was instructed to remove the dressing in 24 hours and that some bruising is to be expected.  She was advised to use over the counter analgesics as needed for any pain at the site.  She is to keep the area dry for 24 hours and to call if her hand or arm becomes cold, numb, or blue.   Orvilla Cornwallachelle Cythina Mickelsen CNM

## 2015-09-08 NOTE — Progress Notes (Signed)
Patient ID: Kirsten Avery, female   DOB: November 25, 1965, 50 y.o.   MRN: [id number]   Chief Complaint  Patient presents with  . Contraception    nexplanon insertion    HPI Kirsten Avery is a 50 y.o. female.  Here for f/u on AUB.  H/O heavy cycles and desires contraception.  Is legally completely blind.    HPI  Past Medical History  Diagnosis Date  . Hypertension   . Blind   . Fibromyalgia     No past surgical history on file.  No family history on file.  Social History Social History  Substance Use Topics  . Smoking status: Never Smoker   . Smokeless tobacco: Never Used  . Alcohol Use: No    Allergies  Allergen Reactions  . Percocet [Oxycodone-Acetaminophen] Nausea Only    Tolerated with food  . Vicodin [Hydrocodone-Acetaminophen] Nausea Only    Tolerated with food    Current Outpatient Prescriptions  Medication Sig Dispense Refill  . gabapentin (NEURONTIN) 300 MG capsule Take 300 mg by mouth 3 (three) times daily.    Marland Kitchen. ibuprofen (ADVIL,MOTRIN) 600 MG tablet Take 1 tablet (600 mg total) by mouth every 6 (six) hours as needed. 30 tablet 0  . lisinopril (PRINIVIL,ZESTRIL) 10 MG tablet Take 10 mg by mouth daily.     No current facility-administered medications for this visit.    Review of Systems Review of Systems Constitutional: negative for fatigue and weight loss Respiratory: negative for cough and wheezing Cardiovascular: negative for chest pain, fatigue and palpitations Gastrointestinal: negative for abdominal pain and change in bowel habits Genitourinary: +AUB Integument/breast: negative for nipple discharge Musculoskeletal:negative for myalgias Neurological: negative for gait problems and tremors Behavioral/Psych: negative for abusive relationship, depression Endocrine: negative for temperature intolerance     Blood pressure 130/86, pulse 73, temperature 97.5 F (36.4 C), weight 220 lb (99.791 kg), last menstrual period 08/10/2015.  Physical  Exam Physical Exam General:   alert  Skin:   no rash or abnormalities  Lungs:   clear to auscultation bilaterally  Heart:   regular rate and rhythm, S1, S2 normal, no murmur, click, rub or gallop  Breasts:   deferred  Abdomen:  normal findings: no organomegaly, soft, non-tender and no hernia  Pelvis:  External genitalia: normal general appearance Urinary system: urethral meatus normal and bladder without fullness, nontender Vaginal: normal without tenderness, induration or masses Cervix: normal appearance Adnexa: normal bimanual exam Uterus: anteverted and non-tender, normal size    50% of 30 min visit spent on counseling and coordination of care.   Data Reviewed Previous medical hx, meds, lbas  Assessment     AUB Contraception management     Plan    Orders Placed This Encounter  Procedures  . POCT urine pregnancy   No orders of the defined types were placed in this encounter.     Follow up as needed.

## 2015-09-10 ENCOUNTER — Telehealth: Payer: Self-pay | Admitting: *Deleted

## 2015-09-10 NOTE — Telephone Encounter (Signed)
See phone note for this encounter. 

## 2016-03-18 ENCOUNTER — Other Ambulatory Visit: Payer: Self-pay | Admitting: Obstetrics

## 2016-03-18 DIAGNOSIS — R52 Pain, unspecified: Secondary | ICD-10-CM

## 2016-06-03 ENCOUNTER — Encounter (HOSPITAL_COMMUNITY): Payer: Self-pay | Admitting: Emergency Medicine

## 2016-06-03 ENCOUNTER — Inpatient Hospital Stay (HOSPITAL_COMMUNITY)
Admission: EM | Admit: 2016-06-03 | Discharge: 2016-06-13 | DRG: 871 | Disposition: A | Discharge status: Skilled Nursing Facility | Payer: Medicare HMO | Attending: Family Medicine | Admitting: Family Medicine

## 2016-06-03 DIAGNOSIS — Z79899 Other long term (current) drug therapy: Secondary | ICD-10-CM

## 2016-06-03 DIAGNOSIS — R109 Unspecified abdominal pain: Secondary | ICD-10-CM | POA: Diagnosis present

## 2016-06-03 DIAGNOSIS — L03116 Cellulitis of left lower limb: Secondary | ICD-10-CM | POA: Diagnosis present

## 2016-06-03 DIAGNOSIS — M797 Fibromyalgia: Secondary | ICD-10-CM | POA: Diagnosis present

## 2016-06-03 DIAGNOSIS — M79605 Pain in left leg: Secondary | ICD-10-CM | POA: Diagnosis not present

## 2016-06-03 DIAGNOSIS — M729 Fibroblastic disorder, unspecified: Secondary | ICD-10-CM

## 2016-06-03 DIAGNOSIS — E2839 Other primary ovarian failure: Secondary | ICD-10-CM

## 2016-06-03 DIAGNOSIS — R7989 Other specified abnormal findings of blood chemistry: Secondary | ICD-10-CM

## 2016-06-03 DIAGNOSIS — M60862 Other myositis, left lower leg: Secondary | ICD-10-CM

## 2016-06-03 DIAGNOSIS — A419 Sepsis, unspecified organism: Secondary | ICD-10-CM | POA: Diagnosis not present

## 2016-06-03 DIAGNOSIS — R0600 Dyspnea, unspecified: Secondary | ICD-10-CM

## 2016-06-03 DIAGNOSIS — F419 Anxiety disorder, unspecified: Secondary | ICD-10-CM | POA: Diagnosis present

## 2016-06-03 DIAGNOSIS — J810 Acute pulmonary edema: Secondary | ICD-10-CM | POA: Insufficient documentation

## 2016-06-03 DIAGNOSIS — Z6841 Body Mass Index (BMI) 40.0 and over, adult: Secondary | ICD-10-CM

## 2016-06-03 DIAGNOSIS — E876 Hypokalemia: Secondary | ICD-10-CM

## 2016-06-03 DIAGNOSIS — J9601 Acute respiratory failure with hypoxia: Secondary | ICD-10-CM | POA: Diagnosis present

## 2016-06-03 DIAGNOSIS — N289 Disorder of kidney and ureter, unspecified: Secondary | ICD-10-CM | POA: Diagnosis present

## 2016-06-03 DIAGNOSIS — H547 Unspecified visual loss: Secondary | ICD-10-CM

## 2016-06-03 DIAGNOSIS — M609 Myositis, unspecified: Secondary | ICD-10-CM | POA: Diagnosis present

## 2016-06-03 DIAGNOSIS — L03119 Cellulitis of unspecified part of limb: Secondary | ICD-10-CM

## 2016-06-03 DIAGNOSIS — R509 Fever, unspecified: Secondary | ICD-10-CM

## 2016-06-03 DIAGNOSIS — H548 Legal blindness, as defined in USA: Secondary | ICD-10-CM | POA: Diagnosis present

## 2016-06-03 DIAGNOSIS — E669 Obesity, unspecified: Secondary | ICD-10-CM | POA: Diagnosis present

## 2016-06-03 DIAGNOSIS — I10 Essential (primary) hypertension: Secondary | ICD-10-CM

## 2016-06-03 MED ORDER — IBUPROFEN 200 MG PO TABS
600.0000 mg | ORAL_TABLET | Freq: Once | ORAL | Status: AC
  Administered 2016-06-04: 600 mg via ORAL
  Filled 2016-06-03: qty 3

## 2016-06-03 NOTE — ED Notes (Signed)
Bed: WA08 Expected date:  Expected time:  Means of arrival:  Comments: 51 yo F  Left leg pain

## 2016-06-03 NOTE — ED Triage Notes (Signed)
Pt comes from home, [facility], [place], c/o is left leg pain, started yesterday. Hx fiber myalgia and describes pain and hurting a lot more than normal.  V/s hr 130, 98 percent room air, rr20, 148/78.  cbg 106. Pulse 118.  Family at bedside.  Pt is blind since birth.  Warm to touch, good cap refill.

## 2016-06-04 ENCOUNTER — Inpatient Hospital Stay (HOSPITAL_COMMUNITY)
Admit: 2016-06-04 | Discharge: 2016-06-04 | Disposition: A | Discharge status: Home / Self Care | Payer: Medicare HMO | Attending: Internal Medicine | Admitting: Internal Medicine

## 2016-06-04 ENCOUNTER — Emergency Department (HOSPITAL_COMMUNITY): Payer: Medicare HMO

## 2016-06-04 ENCOUNTER — Inpatient Hospital Stay (HOSPITAL_COMMUNITY): Payer: Medicare HMO

## 2016-06-04 DIAGNOSIS — J9601 Acute respiratory failure with hypoxia: Secondary | ICD-10-CM | POA: Diagnosis present

## 2016-06-04 DIAGNOSIS — M7989 Other specified soft tissue disorders: Secondary | ICD-10-CM | POA: Diagnosis not present

## 2016-06-04 DIAGNOSIS — I10 Essential (primary) hypertension: Secondary | ICD-10-CM | POA: Diagnosis present

## 2016-06-04 DIAGNOSIS — L03119 Cellulitis of unspecified part of limb: Secondary | ICD-10-CM | POA: Diagnosis not present

## 2016-06-04 DIAGNOSIS — Z79899 Other long term (current) drug therapy: Secondary | ICD-10-CM | POA: Diagnosis not present

## 2016-06-04 DIAGNOSIS — N289 Disorder of kidney and ureter, unspecified: Secondary | ICD-10-CM | POA: Diagnosis present

## 2016-06-04 DIAGNOSIS — M60062 Infective myositis, left lower leg: Secondary | ICD-10-CM | POA: Diagnosis not present

## 2016-06-04 DIAGNOSIS — E669 Obesity, unspecified: Secondary | ICD-10-CM | POA: Diagnosis present

## 2016-06-04 DIAGNOSIS — F419 Anxiety disorder, unspecified: Secondary | ICD-10-CM | POA: Diagnosis present

## 2016-06-04 DIAGNOSIS — R7989 Other specified abnormal findings of blood chemistry: Secondary | ICD-10-CM | POA: Diagnosis not present

## 2016-06-04 DIAGNOSIS — I509 Heart failure, unspecified: Secondary | ICD-10-CM | POA: Diagnosis not present

## 2016-06-04 DIAGNOSIS — H548 Legal blindness, as defined in USA: Secondary | ICD-10-CM | POA: Diagnosis present

## 2016-06-04 DIAGNOSIS — Z888 Allergy status to other drugs, medicaments and biological substances status: Secondary | ICD-10-CM | POA: Diagnosis not present

## 2016-06-04 DIAGNOSIS — M729 Fibroblastic disorder, unspecified: Secondary | ICD-10-CM | POA: Diagnosis present

## 2016-06-04 DIAGNOSIS — Z6841 Body Mass Index (BMI) 40.0 and over, adult: Secondary | ICD-10-CM | POA: Diagnosis not present

## 2016-06-04 DIAGNOSIS — H547 Unspecified visual loss: Secondary | ICD-10-CM | POA: Diagnosis not present

## 2016-06-04 DIAGNOSIS — R109 Unspecified abdominal pain: Secondary | ICD-10-CM | POA: Diagnosis present

## 2016-06-04 DIAGNOSIS — M797 Fibromyalgia: Secondary | ICD-10-CM | POA: Diagnosis present

## 2016-06-04 DIAGNOSIS — Z5181 Encounter for therapeutic drug level monitoring: Secondary | ICD-10-CM | POA: Diagnosis not present

## 2016-06-04 DIAGNOSIS — H543 Unqualified visual loss, both eyes: Secondary | ICD-10-CM | POA: Diagnosis not present

## 2016-06-04 DIAGNOSIS — E876 Hypokalemia: Secondary | ICD-10-CM | POA: Diagnosis present

## 2016-06-04 DIAGNOSIS — A419 Sepsis, unspecified organism: Secondary | ICD-10-CM | POA: Diagnosis present

## 2016-06-04 DIAGNOSIS — L02419 Cutaneous abscess of limb, unspecified: Secondary | ICD-10-CM | POA: Diagnosis not present

## 2016-06-04 DIAGNOSIS — M79605 Pain in left leg: Secondary | ICD-10-CM | POA: Diagnosis present

## 2016-06-04 DIAGNOSIS — M79609 Pain in unspecified limb: Secondary | ICD-10-CM

## 2016-06-04 DIAGNOSIS — L03116 Cellulitis of left lower limb: Secondary | ICD-10-CM | POA: Diagnosis present

## 2016-06-04 DIAGNOSIS — R509 Fever, unspecified: Secondary | ICD-10-CM | POA: Diagnosis not present

## 2016-06-04 DIAGNOSIS — M609 Myositis, unspecified: Secondary | ICD-10-CM | POA: Diagnosis present

## 2016-06-04 DIAGNOSIS — D72829 Elevated white blood cell count, unspecified: Secondary | ICD-10-CM | POA: Diagnosis not present

## 2016-06-04 DIAGNOSIS — J810 Acute pulmonary edema: Secondary | ICD-10-CM | POA: Diagnosis present

## 2016-06-04 DIAGNOSIS — B9689 Other specified bacterial agents as the cause of diseases classified elsewhere: Secondary | ICD-10-CM | POA: Diagnosis not present

## 2016-06-04 LAB — CBC WITH DIFFERENTIAL/PLATELET
Basophils Absolute: 0 10*3/uL (ref 0.0–0.1)
Basophils Relative: 0 %
Eosinophils Absolute: 0 10*3/uL (ref 0.0–0.7)
Eosinophils Relative: 0 %
HEMATOCRIT: 38.9 % (ref 36.0–46.0)
HEMOGLOBIN: 12.7 g/dL (ref 12.0–15.0)
LYMPHS ABS: 0.7 10*3/uL (ref 0.7–4.0)
LYMPHS PCT: 5 %
MCH: 28.3 pg (ref 26.0–34.0)
MCHC: 32.6 g/dL (ref 30.0–36.0)
MCV: 86.6 fL (ref 78.0–100.0)
MONO ABS: 0.6 10*3/uL (ref 0.1–1.0)
MONOS PCT: 4 %
NEUTROS ABS: 13 10*3/uL — AB (ref 1.7–7.7)
Neutrophils Relative %: 91 %
Platelets: 254 10*3/uL (ref 150–400)
RBC: 4.49 MIL/uL (ref 3.87–5.11)
RDW: 15.2 % (ref 11.5–15.5)
WBC: 14.3 10*3/uL — ABNORMAL HIGH (ref 4.0–10.5)

## 2016-06-04 LAB — COMPREHENSIVE METABOLIC PANEL
ALBUMIN: 3.9 g/dL (ref 3.5–5.0)
ALT: 31 U/L (ref 14–54)
ANION GAP: 9 (ref 5–15)
AST: 34 U/L (ref 15–41)
Alkaline Phosphatase: 73 U/L (ref 38–126)
BUN: 18 mg/dL (ref 6–20)
CHLORIDE: 102 mmol/L (ref 101–111)
CO2: 24 mmol/L (ref 22–32)
Calcium: 9.2 mg/dL (ref 8.9–10.3)
Creatinine, Ser: 1.18 mg/dL — ABNORMAL HIGH (ref 0.44–1.00)
GFR calc non Af Amer: 53 mL/min — ABNORMAL LOW (ref >60)
GLUCOSE: 108 mg/dL — AB (ref 65–99)
Potassium: 3.6 mmol/L (ref 3.5–5.1)
Sodium: 135 mmol/L (ref 135–145)
Total Bilirubin: 1.1 mg/dL (ref 0.3–1.2)
Total Protein: 8.3 g/dL — ABNORMAL HIGH (ref 6.5–8.1)

## 2016-06-04 LAB — URINALYSIS, ROUTINE W REFLEX MICROSCOPIC
Bilirubin Urine: NEGATIVE
Glucose, UA: NEGATIVE mg/dL
Ketones, ur: NEGATIVE mg/dL
NITRITE: NEGATIVE
PROTEIN: NEGATIVE mg/dL
Specific Gravity, Urine: 1.015 (ref 1.005–1.030)
pH: 5 (ref 5.0–8.0)

## 2016-06-04 LAB — D-DIMER, QUANTITATIVE (NOT AT ARMC): D DIMER QUANT: 2.04 ug{FEU}/mL — AB (ref 0.00–0.50)

## 2016-06-04 LAB — I-STAT CG4 LACTIC ACID, ED
LACTIC ACID, VENOUS: 1.12 mmol/L (ref 0.5–1.9)
Lactic Acid, Venous: 1.61 mmol/L (ref 0.5–1.9)

## 2016-06-04 LAB — MRSA PCR SCREENING: MRSA by PCR: NEGATIVE

## 2016-06-04 LAB — SEDIMENTATION RATE: SED RATE: 2 mm/h (ref 0–22)

## 2016-06-04 LAB — TSH: TSH: 0.454 u[IU]/mL (ref 0.350–4.500)

## 2016-06-04 LAB — INFLUENZA PANEL BY PCR (TYPE A & B)
Influenza A By PCR: NEGATIVE
Influenza B By PCR: NEGATIVE

## 2016-06-04 LAB — C-REACTIVE PROTEIN: CRP: 23.1 mg/dL — ABNORMAL HIGH (ref <1.0)

## 2016-06-04 MED ORDER — ENOXAPARIN SODIUM 100 MG/ML ~~LOC~~ SOLN
100.0000 mg | SUBCUTANEOUS | Status: AC
  Administered 2016-06-04: 100 mg via SUBCUTANEOUS
  Filled 2016-06-04: qty 1

## 2016-06-04 MED ORDER — LORATADINE 10 MG PO TABS
10.0000 mg | ORAL_TABLET | Freq: Every day | ORAL | Status: DC
  Administered 2016-06-04 – 2016-06-13 (×10): 10 mg via ORAL
  Filled 2016-06-04 (×10): qty 1

## 2016-06-04 MED ORDER — ONDANSETRON HCL 4 MG PO TABS
4.0000 mg | ORAL_TABLET | Freq: Four times a day (QID) | ORAL | Status: DC | PRN
  Administered 2016-06-04 – 2016-06-10 (×3): 4 mg via ORAL
  Filled 2016-06-04 (×3): qty 1

## 2016-06-04 MED ORDER — IOPAMIDOL (ISOVUE-370) INJECTION 76%
INTRAVENOUS | Status: AC
  Filled 2016-06-04: qty 100

## 2016-06-04 MED ORDER — OSELTAMIVIR PHOSPHATE 75 MG PO CAPS
75.0000 mg | ORAL_CAPSULE | Freq: Two times a day (BID) | ORAL | Status: DC
  Administered 2016-06-04 (×2): 75 mg via ORAL
  Filled 2016-06-04 (×3): qty 1

## 2016-06-04 MED ORDER — ENOXAPARIN SODIUM 40 MG/0.4ML ~~LOC~~ SOLN
40.0000 mg | SUBCUTANEOUS | Status: DC
  Administered 2016-06-05 – 2016-06-13 (×8): 40 mg via SUBCUTANEOUS
  Filled 2016-06-04 (×8): qty 0.4

## 2016-06-04 MED ORDER — IBUPROFEN 200 MG PO TABS: 600.0000 mg | ORAL_TABLET | Freq: Four times a day (QID) | ORAL | Status: DC | PRN

## 2016-06-04 MED ORDER — SODIUM CHLORIDE 0.9 % IV BOLUS (SEPSIS)
1000.0000 mL | Freq: Once | INTRAVENOUS | Status: AC
  Administered 2016-06-04: 1000 mL via INTRAVENOUS

## 2016-06-04 MED ORDER — VANCOMYCIN HCL IN DEXTROSE 1-5 GM/200ML-% IV SOLN
1000.0000 mg | Freq: Once | INTRAVENOUS | Status: AC
  Administered 2016-06-04: 1000 mg via INTRAVENOUS
  Filled 2016-06-04: qty 200

## 2016-06-04 MED ORDER — SODIUM CHLORIDE 0.9 % IV BOLUS (SEPSIS)
500.0000 mL | Freq: Once | INTRAVENOUS | Status: AC
  Administered 2016-06-04: 500 mL via INTRAVENOUS

## 2016-06-04 MED ORDER — PIPERACILLIN-TAZOBACTAM 3.375 G IVPB
3.3750 g | Freq: Three times a day (TID) | INTRAVENOUS | Status: DC
  Filled 2016-06-04: qty 50

## 2016-06-04 MED ORDER — ACETAMINOPHEN 325 MG PO TABS
650.0000 mg | ORAL_TABLET | Freq: Once | ORAL | Status: AC
  Administered 2016-06-04: 650 mg via ORAL

## 2016-06-04 MED ORDER — ONDANSETRON HCL 4 MG/2ML IJ SOLN
4.0000 mg | Freq: Four times a day (QID) | INTRAMUSCULAR | Status: DC | PRN
  Administered 2016-06-05 – 2016-06-08 (×7): 4 mg via INTRAVENOUS
  Filled 2016-06-04 (×7): qty 2

## 2016-06-04 MED ORDER — IOPAMIDOL (ISOVUE-370) INJECTION 76%
100.0000 mL | Freq: Once | INTRAVENOUS | Status: AC | PRN
  Administered 2016-06-04: 100 mL via INTRAVENOUS

## 2016-06-04 MED ORDER — DOCUSATE SODIUM 100 MG PO CAPS
100.0000 mg | ORAL_CAPSULE | Freq: Two times a day (BID) | ORAL | Status: DC
  Administered 2016-06-04 – 2016-06-13 (×19): 100 mg via ORAL
  Filled 2016-06-04 (×21): qty 1

## 2016-06-04 MED ORDER — PIPERACILLIN-TAZOBACTAM 3.375 G IVPB: 3.3750 g | Freq: Three times a day (TID) | INTRAVENOUS | Status: DC

## 2016-06-04 MED ORDER — ENOXAPARIN SODIUM 100 MG/ML ~~LOC~~ SOLN: 100.0000 mg | Freq: Two times a day (BID) | SUBCUTANEOUS | Status: DC

## 2016-06-04 MED ORDER — PIPERACILLIN-TAZOBACTAM 3.375 G IVPB 30 MIN
3.3750 g | Freq: Once | INTRAVENOUS | Status: AC
  Administered 2016-06-04: 3.375 g via INTRAVENOUS
  Filled 2016-06-04: qty 50

## 2016-06-04 MED ORDER — IBUPROFEN 200 MG PO TABS
600.0000 mg | ORAL_TABLET | Freq: Four times a day (QID) | ORAL | Status: DC | PRN
Start: 2016-06-04 — End: 2016-06-05

## 2016-06-04 MED ORDER — SODIUM CHLORIDE 0.9 % IV SOLN
INTRAVENOUS | Status: DC
  Administered 2016-06-04 (×2): via INTRAVENOUS

## 2016-06-04 MED ORDER — IPRATROPIUM-ALBUTEROL 0.5-2.5 (3) MG/3ML IN SOLN: 3.0000 mL | RESPIRATORY_TRACT | Status: DC | PRN

## 2016-06-04 MED ORDER — VANCOMYCIN HCL IN DEXTROSE 1-5 GM/200ML-% IV SOLN
1000.0000 mg | Freq: Two times a day (BID) | INTRAVENOUS | Status: DC
  Administered 2016-06-04 – 2016-06-05 (×2): 1000 mg via INTRAVENOUS
  Filled 2016-06-04 (×2): qty 200

## 2016-06-04 MED ORDER — SODIUM CHLORIDE 0.9% FLUSH
3.0000 mL | Freq: Two times a day (BID) | INTRAVENOUS | Status: DC
  Administered 2016-06-04 – 2016-06-13 (×13): 3 mL via INTRAVENOUS

## 2016-06-04 MED ORDER — GABAPENTIN 300 MG PO CAPS
300.0000 mg | ORAL_CAPSULE | Freq: Two times a day (BID) | ORAL | Status: DC
  Administered 2016-06-04 – 2016-06-12 (×13): 300 mg via ORAL
  Filled 2016-06-04 (×18): qty 1

## 2016-06-04 MED ORDER — ACETAMINOPHEN 325 MG PO TABS
650.0000 mg | ORAL_TABLET | Freq: Four times a day (QID) | ORAL | Status: DC | PRN
  Administered 2016-06-04 – 2016-06-09 (×5): 650 mg via ORAL
  Filled 2016-06-04 (×6): qty 2

## 2016-06-04 MED ORDER — AMITRIPTYLINE HCL 25 MG PO TABS
50.0000 mg | ORAL_TABLET | Freq: Every day | ORAL | Status: DC
  Administered 2016-06-04 – 2016-06-12 (×9): 50 mg via ORAL
  Filled 2016-06-04 (×10): qty 2

## 2016-06-04 MED ORDER — FENTANYL CITRATE (PF) 100 MCG/2ML IJ SOLN
25.0000 ug | INTRAMUSCULAR | Status: DC | PRN
  Administered 2016-06-04 – 2016-06-07 (×8): 25 ug via INTRAVENOUS
  Filled 2016-06-04 (×8): qty 2

## 2016-06-04 NOTE — Progress Notes (Signed)
ANTICOAGULATION CONSULT NOTE - Initial Consult  Pharmacy Consult for Enoxaparin Indication: VTE treatment  Allergies  Allergen Reactions  . Percocet [Oxycodone-Acetaminophen] Nausea Only    Tolerated with food  . Vicodin [Hydrocodone-Acetaminophen] Nausea Only    Tolerated with food    Patient Measurements: Height:  (157.5 cm) Weight: 225 lb (102.1 kg) IBW/kg (Calculated) : 50.1 Heparin Dosing Weight:   Vital Signs: Temp: 103.4 F (39.7 C) (04/14 0215) Temp Source: Rectal (04/14 0215) BP: 118/71 (04/14 0422) Pulse Rate: 128 (04/14 0422)  Labs:  Recent Labs  06/04/16 0131  HGB 12.7  HCT 38.9  PLT 254  CREATININE 1.18*    Estimated Creatinine Clearance: 63.8 mL/min (A) (by C-G formula based on SCr of 1.18 mg/dL (H)).   Medical History: Past Medical History:  Diagnosis Date  . Blind   . Fibromyalgia   . Hypertension     Medications:   (Not in a hospital admission) Scheduled:  . enoxaparin (LOVENOX) injection  100 mg Subcutaneous NOW  . enoxaparin (LOVENOX) injection  100 mg Subcutaneous Q12H    Assessment: Patient with enoxaparin ordered for VTE treatment until duplex doppler can be done.  Goal of Therapy:  Anti-Xa level 0.6-1 units/ml 4hrs after LMWH dose given Monitor platelets by anticoagulation protocol: Yes   Plan:  Enoxaparin  sq q12hr  Aleene Davidson Crowford 06/04/2016,6:43 AM

## 2016-06-04 NOTE — H&P (Addendum)
History and Physical    Cai Flott WUJ:[id number] DOB: [birth year] DOA: 06/03/2016  PCP: PROVIDER NOT IN SYSTEM  Patient coming from: Home   I have personally briefly reviewed patient's old medical records in [facility]  Chief Complaint: left leg pain.   HPI: Kirsten Avery is a 51 y.o. female with medical history significant of Blind, HTN who presents complaining of left leg pain, that started 3 days prior to admission after PCP follow up. She relates left leg pain, denies trauma. She also report a history of cough, sinus congestion, runny nose, and sore throat since last week.  She denies chest pain, dyspnea, dysuria, abdominal pain or diarrhea.   ED Course: Patient was found to be febrile Tempeture at 104, tachycardia 131, RR 28, WBC at 14, cr 1.1, lactic acid at 1.1, D dimer 2.0, UA moderate leukocytes. Chest x  Ray no active cardiopulmonary diseases. She received therapeutic dose of Lovenox, while awaiting for Doppler.   Review of Systems: As per HPI otherwise 10 point review of systems negative.    Past Medical History:  Diagnosis Date  . Blind   . Fibromyalgia   . Hypertension     History reviewed. No pertinent surgical history.   reports that she has never smoked. She has never used smokeless tobacco. She reports that she does not drink alcohol or use drugs.  Allergies  Allergen Reactions  . Percocet [Oxycodone-Acetaminophen] Nausea Only    Tolerated with food  . Vicodin [Hydrocodone-Acetaminophen] Nausea Only    Tolerated with food    Family History; Mother; HTN, DM.   Prior to Admission medications   Medication Sig Start Date End Date Taking? Authorizing Provider  amitriptyline (ELAVIL) 50 MG tablet Take 50 mg by mouth daily. 05/15/16  Yes Historical Provider, MD  cetirizine (ZYRTEC) 10 MG tablet Take 10 mg by mouth daily. 04/18/16  Yes Historical Provider, MD  diclofenac (VOLTAREN) 75 MG EC tablet Take 75 mg by mouth 2 (two) times daily. 05/15/16   Yes Historical Provider, MD  gabapentin (NEURONTIN) 300 MG capsule Take 300 mg by mouth 2 (two) times daily.    Yes Historical Provider, MD  ibuprofen (ADVIL,MOTRIN) 600 MG tablet TAKE 1 TABLET BY MOUTH EVERY 6 HOURS AS NEEDED Patient taking differently: TAKE 1 TABLET BY MOUTH EVERY 6 HOURS AS NEEDED FOR PAIN. 03/18/16  Yes Brock Bad, MD  lisinopril-hydrochlorothiazide (PRINZIDE,ZESTORETIC) 20-12.5 MG tablet Take 1 tablet by mouth at bedtime. 05/15/16  Yes Historical Provider, MD    Physical Exam: Vitals:   06/04/16 1000 06/04/16 1030 06/04/16 1100 06/04/16 1130  BP: 110/67 114/75 116/69 119/77  Pulse:      Resp: (!) 36 (!) 33 (!) 34   Temp:      TempSrc:      SpO2:      Weight:      Height:        Constitutional: NAD, calm, comfortable, tachypnea.  Vitals:   06/04/16 1000 06/04/16 1030 06/04/16 1100 06/04/16 1130  BP: 110/67 114/75 116/69 119/77  Pulse:      Resp: (!) 36 (!) 33 (!) 34   Temp:      TempSrc:      SpO2:      Weight:      Height:       Eyes: blind, eyes close.  ENMT: Mucous membranes are moist. Posterior pharynx clear of any exudate or lesions.Normal dentition.  Neck: normal, supple, no masses, no thyromegaly Respiratory: clear to auscultation  bilaterally, no wheezing, no crackles. Tachypnea, . No accessory muscle use.  Cardiovascular: Regular rate and rhythm, no murmurs / rubs / gallops. No extremity edema. 2+ pedal pulses. No carotid bruits.  Abdomen: no tenderness, no masses palpated. No hepatosplenomegaly. Bowel sounds positive.  Musculoskeletal: no clubbing / cyanosis. No joint deformity upper and lower extremities. Good ROM, no contractures. Normal muscle tone.  Skin: no rashes, lesions, ulcers. No induration Neurologic: Blind, rest of cranial nerve intact  Sensation intact, DTR normal. Strength 5/5 in all 4.  Psychiatric: Normal judgment and insight. Alert and oriented x 3. Normal mood.     Labs on Admission: I have personally reviewed following  labs and imaging studies  CBC:  Recent Labs Lab 06/04/16 0131  WBC 14.3*  NEUTROABS 13.0*  HGB 12.7  HCT 38.9  MCV 86.6  PLT 254   Basic Metabolic Panel:  Recent Labs Lab 06/04/16 0131  NA 135  K 3.6  CL 102  CO2 24  GLUCOSE 108*  BUN 18  CREATININE 1.18*  CALCIUM 9.2   GFR: Estimated Creatinine Clearance: 63.8 mL/min (A) (by C-G formula based on SCr of 1.18 mg/dL (H)). Liver Function Tests:  Recent Labs Lab 06/04/16 0131  AST 34  ALT 31  ALKPHOS 73  BILITOT 1.1  PROT 8.3*  ALBUMIN 3.9   No results for input(s): LIPASE, AMYLASE in the last 168 hours. No results for input(s): AMMONIA in the last 168 hours. Coagulation Profile: No results for input(s): INR, PROTIME in the last 168 hours. Cardiac Enzymes: No results for input(s): CKTOTAL, CKMB, CKMBINDEX, TROPONINI in the last 168 hours. BNP (last 3 results) No results for input(s): PROBNP in the last 8760 hours. HbA1C: No results for input(s): HGBA1C in the last 72 hours. CBG: No results for input(s): GLUCAP in the last 168 hours. Lipid Profile: No results for input(s): CHOL, HDL, LDLCALC, TRIG, CHOLHDL, LDLDIRECT in the last 72 hours. Thyroid Function Tests:  Recent Labs  06/04/16 0130  TSH 0.454   Anemia Panel: No results for input(s): VITAMINB12, FOLATE, FERRITIN, TIBC, IRON, RETICCTPCT in the last 72 hours. Urine analysis:    Component Value Date/Time   COLORURINE YELLOW 06/04/2016 0201   APPEARANCEUR HAZY (A) 06/04/2016 0201   LABSPEC 1.015 06/04/2016 0201   PHURINE 5.0 06/04/2016 0201   GLUCOSEU NEGATIVE 06/04/2016 0201   HGBUR MODERATE (A) 06/04/2016 0201   BILIRUBINUR NEGATIVE 06/04/2016 0201   KETONESUR NEGATIVE 06/04/2016 0201   PROTEINUR NEGATIVE 06/04/2016 0201   UROBILINOGEN 1.0 02/04/2013 0014   NITRITE NEGATIVE 06/04/2016 0201   LEUKOCYTESUR MODERATE (A) 06/04/2016 0201    Radiological Exams on Admission: Dg Chest 2 View  Result Date: 06/04/2016 CLINICAL DATA:  Fever  EXAM: CHEST  2 VIEW COMPARISON:  chest radiograph 02/04/2013. FINDINGS: The heart size and mediastinal contours are within normal limits. Both lungs are clear. The visualized skeletal structures are unremarkable. IMPRESSION: No active cardiopulmonary disease. Electronically Signed   By: Deatra Robinson M.D.   On: 06/04/2016 02:50   Dg Knee Complete 4 Views Left  Result Date: 06/04/2016 CLINICAL DATA:  Knee pain EXAM: LEFT KNEE - COMPLETE 4+ VIEW COMPARISON:  None. FINDINGS: No evidence of fracture, dislocation, or joint effusion. No evidence of arthropathy or other focal bone abnormality. Soft tissues are unremarkable. IMPRESSION: No fracture, dislocation or arthropathy of the left knee. Electronically Signed   By: Deatra Robinson M.D.   On: 06/04/2016 02:51    EKG: Independently reviewed. Sinus tachycardia.   Assessment/Plan Active Problems:  Blind   Sepsis (HCC)  1-SIRS;  Presents with fever, cough, congestion, left LE Pain. No LE redness on physical exam, WBC 14.  Admit to step down unit. Unclear source of infection. Continue with IV vancomycin and Zosyn.  Repeat Chest x ray in am.  Follow blood culture, urine culture, influenza panel.  Started on tamiflu, await influenza panel.   2-Left leg pain; no redness on physical exam, mild edema.  On IV antibiotics to cover for infection.  [place] negative for DVT, did showed lymph node.   3-Elevated D dimer;  [place] negative.  Will check CT angio, patient tachypnea and tachycardia.    4-HTN; hold HCTZ/Lisinopril in setting of infection.    DVT prophylaxis: lovenox.  Code Status: full code.  Family Communication: care discussed with patient.  Disposition Plan: home when stable.  Consults called: none Admission status: step down, inpatient./   Hartley Barefoot A MD [facility] Pager [phone]  If 7PM-7AM, please contact night-coverage [email or web address] Password [facility]  06/04/2016, 12:06 PM

## 2016-06-04 NOTE — Progress Notes (Signed)
Discussed case with Dr. Alvino Chapel. Ms. Mckenna is a 51 year old female with pmh HTN, fibromyalgia, a legally blind; who presents from [facility] with c/o Left leg pain since yesterday.  VS: Temperature up to 104.96F, pulse 128-132, respirations up to 28, O2 saturations 92-99% on room air. Left leg with increased warmth , but no significant erythema noted.  Labs revealed WBC 14.3, BUN 18, creatinine 1.18(previously noted to be 0.83), lactic acid 1.61. UA was noted to be abnormal. Chest x-ray otherwise noted to be clear. Influenza screen, d-dimer, ESR, and TSH pending.  Sepsis protocol was initiated and the patient was placed on broad-spectrum antibiotics of vancomycin and Zosyn with blood cultures taken. Lovenox for VTE treatment ordered until duplex Doppler ultrasound lower extremity can be obtained .

## 2016-06-04 NOTE — ED Provider Notes (Signed)
[facility]-EMERGENCY DEPT Provider Note   CSN: [id number] Arrival date & time: 06/03/16  2328    By signing my name below, I, Valentino Saxon, attest that this documentation has been prepared under the direction and in the presence of Benjiman Core, MD. Electronically Signed: Valentino Saxon, ED Scribe. 06/04/16. 12:47 AM.  History   Chief Complaint Chief Complaint  Patient presents with  . Leg Pain    left    The history is provided by the patient and a relative. No language interpreter was used.   HPI Comments: Solyana Nonaka is a 51 y.o. female with PMHx of HTN, fibromyalgia who presents to the Emergency Department complaining of gradually worsening, constant, left leg and calf pain onset yesterday. Pt denies any recent fall or trauma to left leg. She reports associated subjective fever accompanied by chills and "tremors". Pt's temperature in the ED today was 103.2. Pt's daughter describes pt's pain as "beyond unbearable pain". She states her pain is worsened with direct pressure and leg movement. Per pt's daughter, she notes pt's pain subsided minimally after she took all her prescribed medication. Pt's daughter notes pt's pain today is worse than her normal baseline secondary to her fibromyalgia pain. Pt also complains of moderate, constant, headache onset five days ago. No alleviating factors noted for headache. She denies any recent sick contact.   Past Medical History:  Diagnosis Date  . Blind   . Fibromyalgia   . Hypertension     Patient Active Problem List   Diagnosis Date Noted  . Sepsis (HCC) 06/04/2016  . Nexplanon insertion 09/08/2015  . Blind 09/03/2015  . Well woman exam 09/03/2015    History reviewed. No pertinent surgical history.  OB History    Gravida Para Term Preterm AB Living   SAB TAB Ectopic Multiple Live Births   2               Home Medications    Prior to Admission medications   Medication Sig Start Date End Date Taking?  Authorizing Provider  amitriptyline (ELAVIL) 50 MG tablet Take 50 mg by mouth daily. 05/15/16  Yes Historical Provider, MD  cetirizine (ZYRTEC) 10 MG tablet Take 10 mg by mouth daily. 04/18/16  Yes Historical Provider, MD  diclofenac (VOLTAREN) 75 MG EC tablet Take 75 mg by mouth 2 (two) times daily. 05/15/16  Yes Historical Provider, MD  gabapentin (NEURONTIN) 300 MG capsule Take 300 mg by mouth 2 (two) times daily.    Yes Historical Provider, MD  ibuprofen (ADVIL,MOTRIN) 600 MG tablet TAKE 1 TABLET BY MOUTH EVERY 6 HOURS AS NEEDED Patient taking differently: TAKE 1 TABLET BY MOUTH EVERY 6 HOURS AS NEEDED FOR PAIN. 03/18/16  Yes Brock Bad, MD  lisinopril-hydrochlorothiazide (PRINZIDE,ZESTORETIC) 20-12.5 MG tablet Take 1 tablet by mouth at bedtime. 05/15/16  Yes Historical Provider, MD    Family History No family history on file.  Social History Social History  Substance Use Topics  . Smoking status: Never Smoker  . Smokeless tobacco: Never Used  . Alcohol use No     Allergies   Percocet [oxycodone-acetaminophen] and Vicodin [hydrocodone-acetaminophen]   Review of Systems Review of Systems  Constitutional: Positive for chills and fever.  Musculoskeletal: Positive for myalgias.  Neurological: Positive for headaches.  All other systems reviewed and are negative.    Physical Exam Updated Vital Signs BP 112/70 (BP Location: Right Arm)   Pulse (!) 120   Temp 100  F (37.8 C) (Oral)   Resp (!) 24   Ht  (1.575 m)   Wt 225 lb (102.1 kg)   SpO2 100%   BMI 41.15 kg/m   Physical Exam  Constitutional: She appears well-developed and well-nourished.  Blind, moving head freely.   HENT:  Head: Normocephalic and atraumatic.  Eyes: Conjunctivae are normal. Right eye exhibits no discharge. Left eye exhibits no discharge.  Pulmonary/Chest: Effort normal. No respiratory distress.  Mildly harsh breathe sounds.  Musculoskeletal: She exhibits tenderness.  Tenderness to left  calf without erythema or induration. Strong pulses in both feet.   Neurological: She is alert. Coordination normal.  Skin: Skin is warm and dry. No rash noted. She is not diaphoretic. No erythema.  Psychiatric: She has a normal mood and affect.  Nursing note and vitals reviewed.    ED Treatments / Results   DIAGNOSTIC STUDIES: Oxygen Saturation is 99% on RA, normal by my interpretation.    COORDINATION OF CARE: 12:45 AM Discussed treatment plan with pt at bedside which includes labs, chest imaging, EKG, pain medication and code sepsis and pt agreed to plan.    Labs (all labs ordered are listed, but only abnormal results are displayed) Labs Reviewed  COMPREHENSIVE METABOLIC PANEL - Abnormal; Notable for the following:       Result Value   Glucose, Bld 108 (*)    Creatinine, Ser 1.18 (*)    Total Protein 8.3 (*)    GFR calc non Af Amer 53 (*)    All other components within normal limits  CBC WITH DIFFERENTIAL/PLATELET - Abnormal; Notable for the following:    WBC 14.3 (*)    Neutro Abs 13.0 (*)    All other components within normal limits  URINALYSIS, ROUTINE W REFLEX MICROSCOPIC - Abnormal; Notable for the following:    APPearance HAZY (*)    Hgb urine dipstick MODERATE (*)    Leukocytes, UA MODERATE (*)    Bacteria, UA FEW (*)    Squamous Epithelial / LPF 0-5 (*)    All other components within normal limits  D-DIMER, QUANTITATIVE (NOT AT [facility]) - Abnormal; Notable for the following:    D-Dimer, Quant 2.04 (*)    All other components within normal limits  CULTURE, BLOOD (ROUTINE X 2)  CULTURE, BLOOD (ROUTINE X 2)  URINE CULTURE  SEDIMENTATION RATE  TSH  INFLUENZA PANEL BY PCR (TYPE A & B)  C-REACTIVE PROTEIN  I-STAT CG4 LACTIC ACID, ED  I-STAT CG4 LACTIC ACID, ED    EKG  EKG Interpretation  Date/Time:  Saturday June 04 2016 00:00:38 EDT Ventricular Rate:  125 PR Interval:    QRS Duration: 65 QT Interval:  297 QTC Calculation: 429 R Axis:   51 Text  Interpretation:  Sinus tachycardia Abnormal R-wave progression, early transition Confirmed by Rubin Payor  MD, Bryer Cozzolino [phone]) on 06/04/2016 1:54:51 AM       Radiology Dg Chest 2 View  Result Date: 06/04/2016 CLINICAL DATA:  Fever EXAM: CHEST  2 VIEW COMPARISON:  chest radiograph 02/04/2013. FINDINGS: The heart size and mediastinal contours are within normal limits. Both lungs are clear. The visualized skeletal structures are unremarkable. IMPRESSION: No active cardiopulmonary disease. Electronically Signed   By: Deatra Robinson M.D.   On: 06/04/2016 02:50   Dg Knee Complete 4 Views Left  Result Date: 06/04/2016 CLINICAL DATA:  Knee pain EXAM: LEFT KNEE - COMPLETE 4+ VIEW COMPARISON:  None. FINDINGS: No evidence of fracture, dislocation, or joint effusion. No evidence of  arthropathy or other focal bone abnormality. Soft tissues are unremarkable. IMPRESSION: No fracture, dislocation or arthropathy of the left knee. Electronically Signed   By: Deatra Robinson M.D.   On: 06/04/2016 02:51    Procedures Procedures (including critical care time)  Medications Ordered in ED Medications  0.9 %  sodium chloride infusion ( Intravenous Stopped 06/04/16 0655)  acetaminophen (TYLENOL) tablet 650 mg (not administered)  ipratropium-albuterol (DUONEB) 0.5-2.5 (3) MG/3ML nebulizer solution 3 mL (not administered)  enoxaparin (LOVENOX) injection 100 mg (not administered)  piperacillin-tazobactam (ZOSYN) IVPB 3.375 g (not administered)  vancomycin (VANCOCIN) IVPB 1000 mg/200 mL premix (not administered)  fentaNYL (SUBLIMAZE) injection 25 mcg (not administered)  acetaminophen (TYLENOL) tablet 650 mg (not administered)  ibuprofen (ADVIL,MOTRIN) tablet 600 mg (600 mg Oral Given 06/04/16 0005)  sodium chloride 0.9 % bolus 1,000 mL (0 mLs Intravenous Stopped 06/04/16 0340)  sodium chloride 0.9 % bolus 1,000 mL (0 mLs Intravenous Stopped 06/04/16 0340)  piperacillin-tazobactam (ZOSYN) IVPB 3.375 g (0 g Intravenous Stopped  06/04/16 0458)  vancomycin (VANCOCIN) IVPB 1000 mg/200 mL premix (0 mg Intravenous Stopped 06/04/16 0655)  enoxaparin (LOVENOX) injection 100 mg (100 mg Subcutaneous Given 06/04/16 0657)     Initial Impression / Assessment and Plan / ED Course  I have reviewed the triage vital signs and the nursing notes.  Pertinent labs & imaging results that were available during my care of the patient were reviewed by me and considered in my medical decision making (see chart for details).     Patient with fever and leg pain. Temperature up to 104. Lactic acid is however normal. Continue tachycardia after IV fluids. Has not been hypotensive. Has a history of fibromyalgia and everything tends to hurt on her. Flu pending. X-ray does not show pneumonia. Initially not a clear source of fever and antibiotics not started but without much of a source they were later started her doubt meningitis. No dysuria. Admit to internal medicine.  CRITICAL CARE Performed by: Billee Cashing Total critical care time: 30 minutes Critical care time was exclusive of separately billable procedures and treating other patients. Critical care was necessary to treat or prevent imminent or life-threatening deterioration. Critical care was time spent personally by me on the following activities: development of treatment plan with patient and/or surrogate as well as nursing, discussions with consultants, evaluation of patient's response to treatment, examination of patient, obtaining history from patient or surrogate, ordering and performing treatments and interventions, ordering and review of laboratory studies, ordering and review of radiographic studies, pulse oximetry and re-evaluation of patient's condition.   Final Clinical Impressions(s) / ED Diagnoses   Final diagnoses:  Fever, unspecified fever cause    New Prescriptions New Prescriptions   No medications on file    I personally performed the services described in  this documentation, which was scribed in my presence. The recorded information has been reviewed and is accurate.       Benjiman Core, MD 06/04/16 0800

## 2016-06-04 NOTE — Progress Notes (Signed)
Dr. Sunnie Nielsen updated on patient status. Tachypnea in the 40's intermittently, T of 103,  newly assessed redness to the left Leg. No new orders at this time.

## 2016-06-04 NOTE — Progress Notes (Signed)
**  Preliminary report by tech**  Left lower extremity venous duplex complete. There is no obvious evidence of deep or superficial vein thrombosis involving the left lower extremity. All clearly visualized vessels appear patent and compressible. There is no evidence of a Baker's cyst on the left. Incidental findings are consistent with an enlarged lymph node measuring 0.9 cm high by 2.3 cm wide by 3.6 long on the left. Results were given to the patient's nurse, Fleet Contras.   06/04/16 9:08 AM Olen Cordial RVT

## 2016-06-04 NOTE — ED Notes (Signed)
Pt placed in hospital bed

## 2016-06-04 NOTE — Progress Notes (Signed)
Pharmacy Antibiotic Note  Kirsten Avery is a 51 y.o. female admitted on 06/03/2016 with sepsis.  Pharmacy has been consulted for Vancomycin and Zosyn dosing.  Plan: Vancomycin 1gm IV every 12 hours.  Goal trough 15-20 mcg/mL. Zosyn 3.375g IV q8h (4 hour infusion).  Height:  (157.5 cm) Weight: 225 lb (102.1 kg) IBW/kg (Calculated) : 50.1  Temp (24hrs), Avg:103.6 F (39.8 C), Min:103.2 F (39.6 C), Max:104.1 F (40.1 C)   Recent Labs Lab 06/04/16 0113 06/04/16 0131  WBC  --  14.3*  CREATININE  --  1.18*  LATICACIDVEN 1.61  --     Estimated Creatinine Clearance: 63.8 mL/min (A) (by C-G formula based on SCr of 1.18 mg/dL (H)).    Allergies  Allergen Reactions  . Percocet [Oxycodone-Acetaminophen] Nausea Only    Tolerated with food  . Vicodin [Hydrocodone-Acetaminophen] Nausea Only    Tolerated with food    Antimicrobials this admission: Vancomycin 06/04/2016 >> Zosyn 06/04/2016 >>   Dose adjustments this admission: -  Microbiology results: pending  Thank you for allowing pharmacy to be a part of this patient's care.  Aleene Davidson Crowford 06/04/2016 6:42 AM

## 2016-06-05 ENCOUNTER — Inpatient Hospital Stay (HOSPITAL_COMMUNITY): Payer: Medicare HMO

## 2016-06-05 DIAGNOSIS — R509 Fever, unspecified: Secondary | ICD-10-CM

## 2016-06-05 DIAGNOSIS — I10 Essential (primary) hypertension: Secondary | ICD-10-CM

## 2016-06-05 DIAGNOSIS — H543 Unqualified visual loss, both eyes: Secondary | ICD-10-CM

## 2016-06-05 DIAGNOSIS — L02419 Cutaneous abscess of limb, unspecified: Secondary | ICD-10-CM

## 2016-06-05 DIAGNOSIS — L03119 Cellulitis of unspecified part of limb: Secondary | ICD-10-CM

## 2016-06-05 DIAGNOSIS — E876 Hypokalemia: Secondary | ICD-10-CM

## 2016-06-05 LAB — BASIC METABOLIC PANEL
Anion gap: 7 (ref 5–15)
BUN: 14 mg/dL (ref 6–20)
CHLORIDE: 107 mmol/L (ref 101–111)
CO2: 21 mmol/L — ABNORMAL LOW (ref 22–32)
Calcium: 7.2 mg/dL — ABNORMAL LOW (ref 8.9–10.3)
Creatinine, Ser: 1.23 mg/dL — ABNORMAL HIGH (ref 0.44–1.00)
GFR calc Af Amer: 58 mL/min — ABNORMAL LOW (ref >60)
GFR calc non Af Amer: 50 mL/min — ABNORMAL LOW (ref >60)
GLUCOSE: 126 mg/dL — AB (ref 65–99)
POTASSIUM: 3 mmol/L — AB (ref 3.5–5.1)
Sodium: 135 mmol/L (ref 135–145)

## 2016-06-05 LAB — LACTIC ACID, PLASMA
Lactic Acid, Venous: 1.4 mmol/L (ref 0.5–1.9)
Lactic Acid, Venous: 1.3 mmol/L (ref 0.5–1.9)

## 2016-06-05 LAB — URINE CULTURE

## 2016-06-05 LAB — CBC
HEMATOCRIT: 30.4 % — AB (ref 36.0–46.0)
Hemoglobin: 9.9 g/dL — ABNORMAL LOW (ref 12.0–15.0)
MCH: 28 pg (ref 26.0–34.0)
MCHC: 32.6 g/dL (ref 30.0–36.0)
MCV: 85.9 fL (ref 78.0–100.0)
Platelets: 198 10*3/uL (ref 150–400)
RBC: 3.54 MIL/uL — ABNORMAL LOW (ref 3.87–5.11)
RDW: 15.7 % — AB (ref 11.5–15.5)
WBC: 22.2 10*3/uL — ABNORMAL HIGH (ref 4.0–10.5)

## 2016-06-05 LAB — HIV ANTIBODY (ROUTINE TESTING W REFLEX): HIV Screen 4th Generation wRfx: NONREACTIVE

## 2016-06-05 LAB — CK: CK TOTAL: 451 U/L — AB (ref 38–234)

## 2016-06-05 MED ORDER — SODIUM CHLORIDE 0.9 % IV SOLN
1.0000 g | Freq: Three times a day (TID) | INTRAVENOUS | Status: DC
  Administered 2016-06-05 – 2016-06-09 (×13): 1 g via INTRAVENOUS
  Filled 2016-06-05 (×14): qty 1

## 2016-06-05 MED ORDER — NYSTATIN 100000 UNIT/ML MT SUSP
5.0000 mL | Freq: Four times a day (QID) | OROMUCOSAL | Status: DC
  Administered 2016-06-05 – 2016-06-13 (×27): 500000 [IU] via ORAL
  Filled 2016-06-05 (×29): qty 5

## 2016-06-05 MED ORDER — SODIUM CHLORIDE 0.9 % IV SOLN: INTRAVENOUS | Status: DC

## 2016-06-05 MED ORDER — VANCOMYCIN HCL IN DEXTROSE 750-5 MG/150ML-% IV SOLN
750.0000 mg | Freq: Two times a day (BID) | INTRAVENOUS | Status: DC
  Administered 2016-06-05 – 2016-06-07 (×5): 750 mg via INTRAVENOUS
  Filled 2016-06-05 (×6): qty 150

## 2016-06-05 MED ORDER — GADOBENATE DIMEGLUMINE 529 MG/ML IV SOLN
20.0000 mL | Freq: Once | INTRAVENOUS | Status: AC | PRN
  Administered 2016-06-05: 20 mL via INTRAVENOUS

## 2016-06-05 MED ORDER — POTASSIUM CHLORIDE CRYS ER 20 MEQ PO TBCR
40.0000 meq | EXTENDED_RELEASE_TABLET | Freq: Once | ORAL | Status: AC
  Administered 2016-06-05: 40 meq via ORAL
  Filled 2016-06-05: qty 2

## 2016-06-05 MED ORDER — SODIUM CHLORIDE 0.9 % IV SOLN
INTRAVENOUS | Status: DC
  Administered 2016-06-05 – 2016-06-06 (×3): via INTRAVENOUS
  Filled 2016-06-05 (×4): qty 1000

## 2016-06-05 NOTE — Progress Notes (Signed)
PROGRESS NOTE  Kirsten Avery EUM:[id number] DOB: 17-Jul-1965 DOA: 06/03/2016 PCP: No primary care provider on file.  Brief History:  50 year old female with a history of hypertension and fibromyalgia presented with 2-3 day history of fevers, chills, and left lower extremity pain. The patient states that she began having intermittent headaches and sore throat that began on 05/29/2016 with some shortness of breath and nonproductive cough. She states that her headaches and sore throat overall have improved, but continued to have a nonproductive cough and shortness of breath. She denied any chest pain, hemoptysis, nausea, vomiting, diarrhea, abdominal pain, dysuria, hematuria. She denied any recent injury or trauma to her left lower extremity. She denies any recent travels. She has not had any unusual or undercooked foods. She does not have any pets, nor has she had any sick contacts. Upon presentation, the patient was noted to have WBC 14.3 with lactic acid 1.61. Venous duplex of the left lower extremity was negative for DVT. CT angiogram chest was negative for pulmonary embolus.  Influenza was negative and UA was negative for pyuria. The patient was started on empiric vancomycin.  Assessment/Plan: Sepsis -Secondary to left lower extremity cellulitis -Continue vancomycin -Concerned about deep-seated left lower extremity infection -Patient continues to have fevers and worsening leukocytosis -Add meropenem -Continue IV fluids -Urinalysis negative for pyuria -Follow blood cultures -Influenza PCR negative--discontinue oseltamivir  Left lower extremity cellulitis -Concerned about deep-seated left lower extremity infection -MRI left lower extremity -Check CPK -CRP 23.1, ESR 2 -Left lower extremity duplex negative for DVT  Elevated d-dimer -06/04/2016 CT angiogram chest--negative for PE; no pulmonary edema or infiltrates  Hypokalemia -Replete -Check magnesium  Essential  hypertension -Discontinue lisinopril/HCTZ secondary to soft blood pressure  Fibromyalgia -Continue gabapentin and amitriptyline    Disposition Plan:   Remain in stepdown Family Communication:  No Family at bedside  Consultants:  none  Code Status:  FULL   DVT Prophylaxis:  [place] Lovenox   Procedures: As Listed in Progress Note Above  Antibiotics: Zosyn x 1 on 4/14 vanco 4/14>>> merrem 4/15>>>    Subjective: Patient complains of fevers and chills. She has some mild shortness of breath. Denies any hemoptysis but has a nonproductive cough. Has been having intermittent headaches without any neck pain. Complains of left lower extremity pain; mostly below the knee constant; worsening with movement; denies any abdominal pain, dysuria, hematuria, vomiting, diarrhea.  Objective: Vitals:   06/04/16 2300 06/04/16 2345 06/05/16 0600 06/05/16 0700  BP: 116/61  119/65   Pulse: (!) 126  (!) 128 (!) 125  Resp: (!) 38  (!) 36 13  Temp:  (!) 102.9 F (39.4 C)    TempSrc:  Axillary    SpO2: 95%  94% 94%  Weight:      Height:        Intake/Output Summary (Last 24 hours) at 06/05/16 0750 Last data filed at 06/05/16 0700  Gross per 24 hour  Intake             3125 ml  Output              225 ml  Net             2900 ml   Weight change: 5.441 kg (11 lb 15.9 oz) Exam:   General:  Pt is alert, follows commands appropriately, not in acute distress  HEENT: No icterus, No thrush, No neck mass, [place]/AT  Cardiovascular: RRR, S1/S2, no rubs, no  gallops  Respiratory: CTA bilaterally, no wheezing, no crackles, no rhonchi  Abdomen: Soft/+BS, non tender, non distended, no guarding  Extremities: 1+ edema left lower extremity with erythema from the infrapatellar area to the ankle-encompassing the anterior and posterior aspects of the lower extremity. No pain with flexion and extension of the left knee or passive range of motion of the left ankle.   Data Reviewed: I have personally  reviewed following labs and imaging studies Basic Metabolic Panel:  Recent Labs Lab 06/04/16 0131 06/05/16 0358  NA 135 135  K 3.6 3.0*  CL 102 107  CO2 24 21*  GLUCOSE 108* 126*  BUN 18 14  CREATININE 1.18* 1.23*  CALCIUM 9.2 7.2*   Liver Function Tests:  Recent Labs Lab 06/04/16 0131  AST 34  ALT 31  ALKPHOS 73  BILITOT 1.1  PROT 8.3*  ALBUMIN 3.9   No results for input(s): LIPASE, AMYLASE in the last 168 hours. No results for input(s): AMMONIA in the last 168 hours. Coagulation Profile: No results for input(s): INR, PROTIME in the last 168 hours. CBC:  Recent Labs Lab 06/04/16 0131 06/05/16 0358  WBC 14.3* 22.2*  NEUTROABS 13.0*  --   HGB 12.7 9.9*  HCT 38.9 30.4*  MCV 86.6 85.9  PLT 254 198   Cardiac Enzymes: No results for input(s): CKTOTAL, CKMB, CKMBINDEX, TROPONINI in the last 168 hours. BNP: Invalid input(s): POCBNP CBG: No results for input(s): GLUCAP in the last 168 hours. HbA1C: No results for input(s): HGBA1C in the last 72 hours. Urine analysis:    Component Value Date/Time   COLORURINE YELLOW 06/04/2016 0201   APPEARANCEUR HAZY (A) 06/04/2016 0201   LABSPEC 1.015 06/04/2016 0201   PHURINE 5.0 06/04/2016 0201   GLUCOSEU NEGATIVE 06/04/2016 0201   HGBUR MODERATE (A) 06/04/2016 0201   BILIRUBINUR NEGATIVE 06/04/2016 0201   KETONESUR NEGATIVE 06/04/2016 0201   PROTEINUR NEGATIVE 06/04/2016 0201   UROBILINOGEN 1.0 02/04/2013 0014   NITRITE NEGATIVE 06/04/2016 0201   LEUKOCYTESUR MODERATE (A) 06/04/2016 0201   Sepsis Labs: '@LABRCNTIP' (procalcitonin:4,lacticidven:4) ) Recent Results (from the past 240 hour(s))  MRSA PCR Screening     Status: None   Collection Time: 06/04/16  5:09 PM  Result Value Ref Range Status   MRSA by PCR NEGATIVE NEGATIVE Final    Comment:        The GeneXpert MRSA Assay (FDA approved for NASAL specimens only), is one component of a comprehensive MRSA colonization surveillance program. It is not intended  to diagnose MRSA infection nor to guide or monitor treatment for MRSA infections.      Scheduled Meds: . amitriptyline  50 mg Oral Daily  . docusate sodium  100 mg Oral BID  . enoxaparin (LOVENOX) injection  40 mg Subcutaneous Q24H  . gabapentin  300 mg Oral BID  . loratadine  10 mg Oral Daily  . meropenem (MERREM) IV  1 g Intravenous Q8H  . potassium chloride  40 mEq Oral Once  . sodium chloride flush  3 mL Intravenous Q12H  . vancomycin  1,000 mg Intravenous Q12H   Continuous Infusions: . sodium chloride 0.9 % 1,000 mL with potassium chloride 40 mEq infusion      Procedures/Studies: Dg Chest 2 View  Result Date: 06/04/2016 CLINICAL DATA:  Fever EXAM: CHEST  2 VIEW COMPARISON:  chest radiograph 02/04/2013. FINDINGS: The heart size and mediastinal contours are within normal limits. Both lungs are clear. The visualized skeletal structures are unremarkable. IMPRESSION: No active cardiopulmonary disease. Electronically Signed  By: Ulyses Jarred M.D.   On: 06/04/2016 02:50   Ct Angio Chest Pe W Or Wo Contrast  Result Date: 06/04/2016 CLINICAL DATA:  Positive D-dimer, recent trauma 2 left leg, constant left leg and calf pain starting yesterday EXAM: CT ANGIOGRAPHY CHEST WITH CONTRAST TECHNIQUE: Multidetector CT imaging of the chest was performed using the standard protocol during bolus administration of intravenous contrast. Multiplanar CT image reconstructions and MIPs were obtained to evaluate the vascular anatomy. CONTRAST:  100 cc Isovue COMPARISON:  None. FINDINGS: Cardiovascular: Cardiac size is within normal limits. No pericardial effusion. No aortic aneurysm. No pulmonary embolus is noted. There are artifacts from patient's large body habitus. Mediastinum/Nodes: No mediastinal hematoma or adenopathy. No hilar adenopathy is noted. Central airways are patent. Small hiatal hernia. Lungs/Pleura: Images of the lung parenchyma shows no infiltrate or pulmonary edema. Mild atelectasis noted  bilateral lower lobe posteriorly. Upper Abdomen: The visualized upper abdomen is unremarkable. Musculoskeletal: No destructive bony lesions are noted. Sagittal images of the spine shows mild degenerative changes mid and lower thoracic spine. Sagittal view of the sternum is unremarkable. Review of the MIP images confirms the above findings. IMPRESSION: 1. No definite pulmonary embolus is noted. There are artifacts from patient's large body habitus. 2. No mediastinal hematoma or adenopathy.  Small hiatal hernia. 3. No acute infiltrate or pulmonary edema. Dependent atelectasis noted bilateral lower lobe posteriorly. 4. Degenerative changes mid to lower thoracic spine. Electronically Signed   By: Lahoma Crocker M.D.   On: 06/04/2016 13:17   Dg Knee Complete 4 Views Left  Result Date: 06/04/2016 CLINICAL DATA:  Knee pain EXAM: LEFT KNEE - COMPLETE 4+ VIEW COMPARISON:  None. FINDINGS: No evidence of fracture, dislocation, or joint effusion. No evidence of arthropathy or other focal bone abnormality. Soft tissues are unremarkable. IMPRESSION: No fracture, dislocation or arthropathy of the left knee. Electronically Signed   By: Ulyses Jarred M.D.   On: 06/04/2016 02:51    Kylen Ismael, DO  [facility] Pager [phone]  If 7PM-7AM, please contact night-coverage [email or web address] Password [email or web address] 06/05/2016, 7:50 AM   LOS: 1 day  366

## 2016-06-05 NOTE — Progress Notes (Signed)
Pharmacy Antibiotic Note  Kirsten Avery is a 51 y.o. female admitted on 06/03/2016 with sepsis due to LE cellulitis.  Pharmacy has been consulted for Vancomycin and Zosyn dosing. [facility] has changed zosyn to meropenem 4/15 for concerns of deep-seeded infection  Today, 06/05/2016 Day #2 antibiotics  WBC increased to 22  Remains febrile  Scr slightly increase overnight, normalized CrCL = 62 ml/min  Plan:  Based on normalized CrCL and weight, adjust vancomycin to  IV q12h  Check trough if remains on vancomycin for 48h, check sooner for worsening renal fx  Check daily SCr d/t elevated SCr  Meropenem dose appropriate as per physician orders  Height:  (157.5 cm) Weight: 236 lb 15.9 oz (107.5 kg) IBW/kg (Calculated) : 50.1  Temp (24hrs), Avg:103 F (39.4 C), Min:102.9 F (39.4 C), Max:103.1 F (39.5 C)   Recent Labs Lab 06/04/16 0113 06/04/16 0131 06/04/16 0542 06/05/16 0358  WBC  --  14.3*  --  22.2*  CREATININE  --  1.18*  --  1.23*  LATICACIDVEN 1.61  --  1.12  --     Estimated Creatinine Clearance: 63.1 mL/min (A) (by C-G formula based on SCr of 1.23 mg/dL (H)).    Allergies  Allergen Reactions  . Percocet [Oxycodone-Acetaminophen] Nausea Only    Tolerated with food  . Vicodin [Hydrocodone-Acetaminophen] Nausea Only    Tolerated with food    Antimicrobials this admission:  4/14 tamiflu >> 4/14 4/14 zosyn >> 4/14 4/14 vanco >> 4/15 meropenem >>  Dose adjustments this admission:   Microbiology results:  4/14 flu panel: neg 4/14 BCx: pending 4/14 UCx: mx spp  4/14 MRSA PCR: neg  Thank you for allowing pharmacy to be a part of this patient's care.  Juliette Alcide, PharmD, BCPS.   Pager: [phone] 06/05/2016 10:20 AM

## 2016-06-06 ENCOUNTER — Inpatient Hospital Stay (HOSPITAL_COMMUNITY): Payer: Medicare HMO

## 2016-06-06 ENCOUNTER — Encounter (HOSPITAL_COMMUNITY): Payer: Self-pay | Admitting: Radiology

## 2016-06-06 DIAGNOSIS — M729 Fibroblastic disorder, unspecified: Secondary | ICD-10-CM

## 2016-06-06 LAB — BRAIN NATRIURETIC PEPTIDE: B NATRIURETIC PEPTIDE 5: 305.9 pg/mL — AB (ref 0.0–100.0)

## 2016-06-06 LAB — COMPREHENSIVE METABOLIC PANEL
ALBUMIN: 2.4 g/dL — AB (ref 3.5–5.0)
ALT: 24 U/L (ref 14–54)
AST: 28 U/L (ref 15–41)
Alkaline Phosphatase: 57 U/L (ref 38–126)
Anion gap: 7 (ref 5–15)
BUN: 10 mg/dL (ref 6–20)
CHLORIDE: 110 mmol/L (ref 101–111)
CO2: 21 mmol/L — AB (ref 22–32)
Calcium: 7.4 mg/dL — ABNORMAL LOW (ref 8.9–10.3)
Creatinine, Ser: 0.9 mg/dL (ref 0.44–1.00)
GFR calc Af Amer: >60 mL/min (ref >60)
GLUCOSE: 110 mg/dL — AB (ref 65–99)
POTASSIUM: 3.5 mmol/L (ref 3.5–5.1)
SODIUM: 138 mmol/L (ref 135–145)
Total Bilirubin: 2 mg/dL — ABNORMAL HIGH (ref 0.3–1.2)
Total Protein: 6 g/dL — ABNORMAL LOW (ref 6.5–8.1)

## 2016-06-06 LAB — CBC WITH DIFFERENTIAL/PLATELET
BASOS ABS: 0 10*3/uL (ref 0.0–0.1)
BASOS PCT: 0 %
Eosinophils Absolute: 0 10*3/uL (ref 0.0–0.7)
Eosinophils Relative: 0 %
HCT: 29.4 % — ABNORMAL LOW (ref 36.0–46.0)
Hemoglobin: 9.6 g/dL — ABNORMAL LOW (ref 12.0–15.0)
Lymphocytes Relative: 5 %
Lymphs Abs: 1.2 10*3/uL (ref 0.7–4.0)
MCH: 27.9 pg (ref 26.0–34.0)
MCHC: 32.7 g/dL (ref 30.0–36.0)
MCV: 85.5 fL (ref 78.0–100.0)
Monocytes Absolute: 1.2 10*3/uL — ABNORMAL HIGH (ref 0.1–1.0)
Monocytes Relative: 5 %
Neutro Abs: 20.1 10*3/uL — ABNORMAL HIGH (ref 1.7–7.7)
Neutrophils Relative %: 90 %
PLATELETS: 205 10*3/uL (ref 150–400)
RBC: 3.44 MIL/uL — AB (ref 3.87–5.11)
RDW: 16.2 % — ABNORMAL HIGH (ref 11.5–15.5)
WBC: 22.5 10*3/uL — AB (ref 4.0–10.5)

## 2016-06-06 LAB — PROCALCITONIN: PROCALCITONIN: 3.18 ng/mL

## 2016-06-06 LAB — CK: CK TOTAL: 293 U/L — AB (ref 38–234)

## 2016-06-06 MED ORDER — POTASSIUM CHLORIDE CRYS ER 20 MEQ PO TBCR
40.0000 meq | EXTENDED_RELEASE_TABLET | Freq: Once | ORAL | Status: AC
  Administered 2016-06-06: 40 meq via ORAL
  Filled 2016-06-06: qty 2

## 2016-06-06 MED ORDER — TRAMADOL HCL 50 MG PO TABS
50.0000 mg | ORAL_TABLET | Freq: Four times a day (QID) | ORAL | Status: DC | PRN
  Administered 2016-06-08 – 2016-06-13 (×6): 50 mg via ORAL
  Filled 2016-06-06 (×6): qty 1

## 2016-06-06 MED ORDER — IOPAMIDOL (ISOVUE-300) INJECTION 61%
INTRAVENOUS | Status: AC
  Filled 2016-06-06: qty 100

## 2016-06-06 MED ORDER — ALUM & MAG HYDROXIDE-SIMETH 200-200-20 MG/5ML PO SUSP
15.0000 mL | Freq: Four times a day (QID) | ORAL | Status: DC | PRN
  Administered 2016-06-06 – 2016-06-08 (×2): 15 mL via ORAL
  Filled 2016-06-06 (×2): qty 30

## 2016-06-06 MED ORDER — IOPAMIDOL (ISOVUE-300) INJECTION 61%
100.0000 mL | Freq: Once | INTRAVENOUS | Status: AC | PRN
  Administered 2016-06-06: 100 mL via INTRAVENOUS

## 2016-06-06 MED ORDER — GUAIFENESIN-DM 100-10 MG/5ML PO SYRP
5.0000 mL | ORAL_SOLUTION | ORAL | Status: DC | PRN
  Administered 2016-06-06 – 2016-06-11 (×4): 5 mL via ORAL
  Filled 2016-06-06 (×5): qty 10

## 2016-06-06 MED ORDER — IOPAMIDOL (ISOVUE-300) INJECTION 61%: 15.0000 mL | Freq: Two times a day (BID) | INTRAVENOUS | Status: DC | PRN

## 2016-06-06 MED ORDER — IOPAMIDOL (ISOVUE-300) INJECTION 61%
INTRAVENOUS | Status: AC
  Administered 2016-06-06: 11:00:00
  Filled 2016-06-06: qty 30

## 2016-06-06 MED ORDER — FUROSEMIDE 10 MG/ML IJ SOLN
40.0000 mg | Freq: Once | INTRAMUSCULAR | Status: AC
  Administered 2016-06-06: 40 mg via INTRAVENOUS
  Filled 2016-06-06: qty 4

## 2016-06-06 NOTE — Progress Notes (Signed)
PROGRESS NOTE  Kirsten Avery NKN:[id number] DOB: Oct 16, 1965 DOA: 06/03/2016 PCP: No primary care provider on file.  Brief History:  51 year old female with a history of hypertension and fibromyalgia presented with 2-3 day history of fevers, chills, and left lower extremity pain. The patient states that she began having intermittent headaches and sore throat that began on 05/29/2016 with some shortness of breath and nonproductive cough. She states that her headaches and sore throat overall have improved, but continued to have a nonproductive cough and shortness of breath. She denied any chest pain, hemoptysis, nausea, vomiting, diarrhea, abdominal pain, dysuria, hematuria. She denied any recent injury or trauma to her left lower extremity. She denies any recent travels. She has not had any unusual or undercooked foods. She does not have any pets, nor has she had any sick contacts. Upon presentation, the patient was noted to have WBC 14.3 with lactic acid 1.61. Venous duplex of the left lower extremity was negative for DVT. CT angiogram chest was negative for pulmonary embolus.  Influenza was negative and UA was negative for pyuria. The patient was started on empiric vancomycin.  Assessment/Plan: Sepsis -Secondary to left lower extremity cellulitis -Continue vancomycin -Concerned about deep-seated left lower extremity infection -Patient continues to have fevers and worsening leukocytosis -Continue meropenem -Continue IV fluids -Urinalysis negative for pyuria -Follow blood cultures--neg to date -Influenza PCR negative--discontinue oseltamivir  Left lower extremity cellulitis/Fasciitis/Myositis -Concerned about deep-seated left lower extremity infection -MRI left lower extremity -Check CPK 451>>>293 -CRP 23.1, ESR 2 -Left lower extremity duplex negative for DVT -06/05/2008 MRI LLE--abnormal edema along muscle margins of the gastrocnemius and soleus with involvement along the  superficial fascial planes and deep fascial planes between muscles; low level enhancement of fascial planes; no soft tissue gas  -repeat CRP and CK in am  Elevated d-dimer -06/04/2016 CT angiogram chest--negative for PE; no pulmonary edema or infiltrates  Dyspnea/Tachypnea/Hypoxia -due to hypoventilation -partly attributable to pain/anxiety -CXR -CTA chest as above -CT abd/pelvis-->distension pushing on diaphragm  Hypokalemia -Replete -Check magnesium  Essential hypertension -Discontinue lisinopril/HCTZ secondary to soft blood pressure and renal insufficiency -hydralazine prn SBP >180  Fibromyalgia -Continue gabapentin and amitriptyline  Abdominal pain -CT abd/pelvis    Disposition Plan:   Remain in stepdown Family Communication:  Son and daughter updated at bedside--4/16  Consultants:  none  Code Status:  FULL   DVT Prophylaxis:  [place] Lovenox   Procedures: As Listed in Progress Note Above  Antibiotics: Zosyn x 1 on 4/14 vanco 4/14>>> merrem 4/15>>>     Subjective: Patient continues complaining of a fluctuant appearing but it is a little bit better than yesterday. Pain is worse with any type of movement and sharp in nature isolated in the calf.  Complains of some shortness of breath but denies any chest pain, dizziness, vomiting, diarrhea. No bowel movement in 1 week, but passing flatus.   Objective: Vitals:   06/06/16 0808 06/06/16 0900 06/06/16 1000 06/06/16 1020  BP: (!) 160/90 (!) 158/92 (!) 100/20 (!) 159/77  Pulse: (!) 118 (!) 115 (!) 117 (!) 116  Resp: (!) 34 (!) 33 (!) 32 (!) 47  Temp:      TempSrc:      SpO2: 94% 92% 93% 94%  Weight:      Height:        Intake/Output Summary (Last 24 hours) at 06/06/16 1051 Last data filed at 06/06/16 1000  Gross per 24 hour  Intake  3018 ml  Output             1150 ml  Net             1868 ml   Weight change:  Exam:   General:  Pt is alert, follows commands appropriately,  not in acute distress  HEENT: No icterus, No thrush, No neck mass, [place]/AT  Cardiovascular: RRR, S1/S2, no rubs, no gallops  Respiratory: CTA bilaterally, no wheezing, no crackles, no rhonchi  Abdomen: Soft/+BS, RUQ/RLQ tender, mildly distended, no guarding  Extremities: 1 + LLE edema, No lymphangitis, No petechiae, No rashes, no synovitis--of left knee or left ankle   Data Reviewed: I have personally reviewed following labs and imaging studies Basic Metabolic Panel:  Recent Labs Lab 06/04/16 0131 06/05/16 0358 06/06/16 0352  NA 135 135 138  K 3.6 3.0* 3.5  CL 102 107 110  CO2 24 21* 21*  GLUCOSE 108* 126* 110*  BUN '18 14 10  ' CREATININE 1.18* 1.23* 0.90  CALCIUM 9.2 7.2* 7.4*   Liver Function Tests:  Recent Labs Lab 06/04/16 0131 06/06/16 0352  AST 34 28  ALT 31 24  ALKPHOS 73 57  BILITOT 1.1 2.0*  PROT 8.3* 6.0*  ALBUMIN 3.9 2.4*   No results for input(s): LIPASE, AMYLASE in the last 168 hours. No results for input(s): AMMONIA in the last 168 hours. Coagulation Profile: No results for input(s): INR, PROTIME in the last 168 hours. CBC:  Recent Labs Lab 06/04/16 0131 06/05/16 0358 06/06/16 0352  WBC 14.3* 22.2* 22.5*  NEUTROABS 13.0*  --  20.1*  HGB 12.7 9.9* 9.6*  HCT 38.9 30.4* 29.4*  MCV 86.6 85.9 85.5  PLT 254 198 205   Cardiac Enzymes:  Recent Labs Lab 06/05/16 1235 06/06/16 0352  CKTOTAL 451* 293*   BNP: Invalid input(s): POCBNP CBG: No results for input(s): GLUCAP in the last 168 hours. HbA1C: No results for input(s): HGBA1C in the last 72 hours. Urine analysis:    Component Value Date/Time   COLORURINE YELLOW 06/04/2016 0201   APPEARANCEUR HAZY (A) 06/04/2016 0201   LABSPEC 1.015 06/04/2016 0201   PHURINE 5.0 06/04/2016 0201   GLUCOSEU NEGATIVE 06/04/2016 0201   HGBUR MODERATE (A) 06/04/2016 0201   BILIRUBINUR NEGATIVE 06/04/2016 0201   KETONESUR NEGATIVE 06/04/2016 0201   PROTEINUR NEGATIVE 06/04/2016 0201   UROBILINOGEN  1.0 02/04/2013 0014   NITRITE NEGATIVE 06/04/2016 0201   LEUKOCYTESUR MODERATE (A) 06/04/2016 0201   Sepsis Labs: '@[email or web address]' (procalcitonin:4,lacticidven:4) ) Recent Results (from the past 240 hour(s))  Blood Culture (routine x 2)     Status: None (Preliminary result)   Collection Time: 06/04/16  1:40 AM  Result Value Ref Range Status   Specimen Description BLOOD RIGHT ARM  Final   Special Requests   Final    BOTTLES DRAWN AEROBIC AND ANAEROBIC Blood Culture adequate volume   Culture   Final    NO GROWTH 1 DAY Performed at [facility], [place] [address], [place], [place] [address]    Report Status PENDING  Incomplete  Blood Culture (routine x 2)     Status: None (Preliminary result)   Collection Time: 06/04/16  1:55 AM  Result Value Ref Range Status   Specimen Description BLOOD LEFT HAND  Final   Special Requests   Final    BOTTLES DRAWN AEROBIC AND ANAEROBIC Blood Culture adequate volume   Culture   Final    NO GROWTH 1 DAY Performed at [facility], [address] [place], [place] [address]    Report Status PENDING  Incomplete  Culture, Urine     Status: Abnormal   Collection Time: 06/04/16  2:01 AM  Result Value Ref Range Status   Specimen Description URINE, RANDOM  Final   Special Requests NONE  Final   Culture MULTIPLE SPECIES PRESENT, SUGGEST RECOLLECTION (A)  Final   Report Status 06/05/2016 FINAL  Final  MRSA PCR Screening     Status: None   Collection Time: 06/04/16  5:09 PM  Result Value Ref Range Status   MRSA by PCR NEGATIVE NEGATIVE Final    Comment:        The GeneXpert MRSA Assay (FDA approved for NASAL specimens only), is one component of a comprehensive MRSA colonization surveillance program. It is not intended to diagnose MRSA infection nor to guide or monitor treatment for MRSA infections.      Scheduled Meds: . amitriptyline  50 mg Oral Daily  . docusate sodium  100 mg Oral BID  . enoxaparin (LOVENOX) injection  40 mg Subcutaneous  Q24H  . gabapentin  300 mg Oral BID  . loratadine  10 mg Oral Daily  . meropenem (MERREM) IV  1 g Intravenous Q8H  . nystatin  5 mL Oral QID  . sodium chloride flush  3 mL Intravenous Q12H  . vancomycin  750 mg Intravenous Q12H   Continuous Infusions: . sodium chloride 0.9 % 1,000 mL with potassium chloride 40 mEq infusion 100 mL/hr at 06/06/16 1026    Procedures/Studies: Dg Chest 2 View  Result Date: 06/04/2016 CLINICAL DATA:  Fever EXAM: CHEST  2 VIEW COMPARISON:  chest radiograph 02/04/2013. FINDINGS: The heart size and mediastinal contours are within normal limits. Both lungs are clear. The visualized skeletal structures are unremarkable. IMPRESSION: No active cardiopulmonary disease. Electronically Signed   By: Ulyses Jarred M.D.   On: 06/04/2016 02:50   Ct Angio Chest Pe W Or Wo Contrast  Result Date: 06/04/2016 CLINICAL DATA:  Positive D-dimer, recent trauma 2 left leg, constant left leg and calf pain starting yesterday EXAM: CT ANGIOGRAPHY CHEST WITH CONTRAST TECHNIQUE: Multidetector CT imaging of the chest was performed using the standard protocol during bolus administration of intravenous contrast. Multiplanar CT image reconstructions and MIPs were obtained to evaluate the vascular anatomy. CONTRAST:  100 cc Isovue COMPARISON:  None. FINDINGS: Cardiovascular: Cardiac size is within normal limits. No pericardial effusion. No aortic aneurysm. No pulmonary embolus is noted. There are artifacts from patient's large body habitus. Mediastinum/Nodes: No mediastinal hematoma or adenopathy. No hilar adenopathy is noted. Central airways are patent. Small hiatal hernia. Lungs/Pleura: Images of the lung parenchyma shows no infiltrate or pulmonary edema. Mild atelectasis noted bilateral lower lobe posteriorly. Upper Abdomen: The visualized upper abdomen is unremarkable. Musculoskeletal: No destructive bony lesions are noted. Sagittal images of the spine shows mild degenerative changes mid and lower  thoracic spine. Sagittal view of the sternum is unremarkable. Review of the MIP images confirms the above findings. IMPRESSION: 1. No definite pulmonary embolus is noted. There are artifacts from patient's large body habitus. 2. No mediastinal hematoma or adenopathy.  Small hiatal hernia. 3. No acute infiltrate or pulmonary edema. Dependent atelectasis noted bilateral lower lobe posteriorly. 4. Degenerative changes mid to lower thoracic spine. Electronically Signed   By: Lahoma Crocker M.D.   On: 06/04/2016 13:17   Mr Tibia Fibula Left W Wo Contrast  Result Date: 06/05/2016 CLINICAL DATA:  Left leg and calf pain onset yesterday. Fibromyalgia. EXAM:  MRI OF LOWER LEFT EXTREMITY WITHOUT AND WITH CONTRAST TECHNIQUE: Multiplanar, multisequence MR imaging of the left calf was performed both before and after administration of intravenous contrast. CONTRAST:  62m MULTIHANCE GADOBENATE DIMEGLUMINE 529 MG/ML IV SOLN COMPARISON:  06/04/2016 knee radiographs FINDINGS: Bones/Joint/Cartilage Small to moderate left knee effusion. There is only mild associated synovial enhancement, and accordingly I am skeptical of septic joint. Ligaments Today's imaging was performed with very large field of view and is not suitable for assessing small ligamentous structures. Muscles, Tendons, and soft tissues Abnormal edema tracking along the gastrocnemius muscle margins (especially medially) and soleus muscle from the knee down to the distal tibial level, with involvement along both the superficial fascia planes and also along deep fascia planes between the muscles, with low-level associated enhancement along the fascia planes. No gas is tracking in the soft tissues. There is potentially some very subtle accentuated edema signal in the soleus and medial head gastrocnemius muscles, in a faint patchy distribution. Subcutaneous edema also tracks along the superficial fascia margin medially, anteriorly, and posteriorly in the calf. Anterior  compartment does not appear involved. There is some expansion of the subcutaneous space on the left medial calf compared to the right. There appears to be some edema in the left distal thigh tracking around the iliotibial tract and biceps femoris, image 1/6. IMPRESSION: 1. Abnormal edema tracking along and to a lesser extent within the medial head gastrocnemius, soleus, and lateral head gastrocnemius in the calf, with low-level associated enhancement. No definite gas in the soft tissues. However, this may reflect early fasciitis with low-level myositis. There is also some edema tracking along the distal iliotibial tract and distal biceps femoris musculotendinous junction in the knee. No anterior compartmental involvement. 2. Small knee effusion. I am skeptical of septic joint given the minimal degree of synovial enhancement. 3. Subcutaneous edema and low-level enhancement especially medially in the calf potentially from cellulitis. Electronically Signed   By: WVan ClinesM.D.   On: 06/05/2016 11:25   Dg Knee Complete 4 Views Left  Result Date: 06/04/2016 CLINICAL DATA:  Knee pain EXAM: LEFT KNEE - COMPLETE 4+ VIEW COMPARISON:  None. FINDINGS: No evidence of fracture, dislocation, or joint effusion. No evidence of arthropathy or other focal bone abnormality. Soft tissues are unremarkable. IMPRESSION: No fracture, dislocation or arthropathy of the left knee. Electronically Signed   By: KUlyses JarredM.D.   On: 06/04/2016 02:51    Joelee Snoke, DO  [facility] Pager [phone] If 7PM-7AM, please contact night-coverage [email or web address] Password [email or web address] 06/06/2016, 10:51 AM   LOS: 2 days

## 2016-06-06 NOTE — Care Management Note (Signed)
Case Management Note  Patient Details  Name: Kirsten Avery MRN: [id number] Date of Birth: 01/14/1966  Subjective/Objective:            sepsis        Action/Plan:Date:  June 06, 2016 Chart reviewed for concurrent status and case management needs. Will continue to follow patient progress. Discharge Planning: following for needs Expected discharge date: [id number] [name], BSN, [place], [place]   [phone]   Expected Discharge Date:                  Expected Discharge Plan:  Home/Self Care  In-House Referral:     Discharge planning Services     Post Acute Care Choice:    Choice offered to:     DME Arranged:    DME Agency:     HH Arranged:    HH Agency:     Status of Service:  In process, will continue to follow  If discussed at Long Length of Stay Meetings, dates discussed:    Additional Comments:  [name], RN 06/06/2016, 11:31 AM

## 2016-06-07 ENCOUNTER — Other Ambulatory Visit (HOSPITAL_COMMUNITY): Payer: Medicare Other

## 2016-06-07 DIAGNOSIS — J810 Acute pulmonary edema: Secondary | ICD-10-CM | POA: Insufficient documentation

## 2016-06-07 LAB — BASIC METABOLIC PANEL
ANION GAP: 7 (ref 5–15)
BUN: 11 mg/dL (ref 6–20)
CALCIUM: 7.7 mg/dL — AB (ref 8.9–10.3)
CO2: 24 mmol/L (ref 22–32)
Chloride: 105 mmol/L (ref 101–111)
Creatinine, Ser: 0.77 mg/dL (ref 0.44–1.00)
GFR calc Af Amer: >60 mL/min (ref >60)
GFR calc non Af Amer: >60 mL/min (ref >60)
GLUCOSE: 105 mg/dL — AB (ref 65–99)
Potassium: 3.5 mmol/L (ref 3.5–5.1)
Sodium: 136 mmol/L (ref 135–145)

## 2016-06-07 LAB — CK: Total CK: 148 U/L (ref 38–234)

## 2016-06-07 LAB — CBC
HEMATOCRIT: 28.9 % — AB (ref 36.0–46.0)
HEMOGLOBIN: 9.6 g/dL — AB (ref 12.0–15.0)
MCH: 27.8 pg (ref 26.0–34.0)
MCHC: 33.2 g/dL (ref 30.0–36.0)
MCV: 83.8 fL (ref 78.0–100.0)
Platelets: 223 10*3/uL (ref 150–400)
RBC: 3.45 MIL/uL — AB (ref 3.87–5.11)
RDW: 16.2 % — ABNORMAL HIGH (ref 11.5–15.5)
WBC: 23.8 10*3/uL — ABNORMAL HIGH (ref 4.0–10.5)

## 2016-06-07 LAB — PROCALCITONIN: Procalcitonin: 2.1 ng/mL

## 2016-06-07 LAB — C-REACTIVE PROTEIN: CRP: 26.1 mg/dL — ABNORMAL HIGH (ref <1.0)

## 2016-06-07 LAB — VANCOMYCIN, TROUGH: VANCOMYCIN TR: 6 ug/mL — AB (ref 15–20)

## 2016-06-07 MED ORDER — POTASSIUM CHLORIDE CRYS ER 20 MEQ PO TBCR
40.0000 meq | EXTENDED_RELEASE_TABLET | Freq: Every day | ORAL | Status: DC
  Administered 2016-06-07 – 2016-06-10 (×4): 40 meq via ORAL
  Filled 2016-06-07 (×4): qty 2

## 2016-06-07 MED ORDER — FUROSEMIDE 10 MG/ML IJ SOLN
40.0000 mg | Freq: Two times a day (BID) | INTRAMUSCULAR | Status: DC
  Administered 2016-06-07 – 2016-06-12 (×10): 40 mg via INTRAVENOUS
  Filled 2016-06-07 (×10): qty 4

## 2016-06-07 MED ORDER — VANCOMYCIN HCL 10 G IV SOLR
1250.0000 mg | Freq: Two times a day (BID) | INTRAVENOUS | Status: DC
  Administered 2016-06-07 – 2016-06-09 (×4): 1250 mg via INTRAVENOUS
  Filled 2016-06-07 (×6): qty 1250

## 2016-06-07 NOTE — Progress Notes (Signed)
Pharmacy Antibiotic Note  Kirsten Avery is a 51 y.o. female admitted on 06/03/2016 with sepsis due to LE cellulitis.  Pharmacy has been consulted for Vancomycin and Zosyn dosing. TR[facility] has changed zosyn to meropenem 4/15 for concerns of deep-seeded infection  Today, 06/07/2016 Day #4 antibiotics  WBC continue to rise  Last fever 4/16  SCr normalized  Vancomycin trough below goal at 6 mcg/ml on 750 mg IV q12  Plan:  increase vancomycin to  IV q12h  Check daily SCr d/t elevated SCr  Meropenem dose appropriate as per physician orders  Height:  (157.5 cm) Weight: 236 lb 15.9 oz (107.5 kg) IBW/kg (Calculated) : 50.1  Temp (24hrs), Avg:98.3 F (36.8 C), Min:97.6 F (36.4 C), Max:99.2 F (37.3 C)   Recent Labs Lab 06/04/16 0113 06/04/16 0131 06/04/16 0542 06/05/16 0358 06/05/16 1235 06/05/16 1505 06/06/16 0352 06/07/16 0315 06/07/16 1701  WBC  --  14.3*  --  22.2*  --   --  22.5* 23.8*  --   CREATININE  --  1.18*  --  1.23*  --   --  0.90 0.77  --   LATICACIDVEN 1.61  --  1.12  --  1.4 1.3  --   --   --   VANCOTROUGH  --   --   --   --   --   --   --   --  6*    Estimated Creatinine Clearance: 97.1 mL/min (by C-G formula based on SCr of 0.77 mg/dL).    Allergies  Allergen Reactions  . Percocet [Oxycodone-Acetaminophen] Nausea Only    Tolerated with food  . Vicodin [Hydrocodone-Acetaminophen] Nausea Only    Tolerated with food    Antimicrobials this admission:  4/14 tamiflu >> 4/14 4/14 zosyn >> 4/14 4/14 vanco >> 4/15 meropenem >>  Dose adjustments this admission:  4/15 vanco 1gm q12 to  q12 per obesity nomogram and norm CrCl = 63ml/min 4/17 VT 6 on 750 q12 Microbiology results:  4/14 flu panel: neg 4/14 BCx: ngtd 4/14 UCx: mx spp  4/14 MRSA PCR: neg  Thank you for allowing pharmacy to be a part of this patient's care.  Herby Abraham, Pharm.D. [phone] 06/07/2016 6:34 PM

## 2016-06-07 NOTE — Progress Notes (Signed)
Pt concerned about elevated BP. Has been taking Lisinopril at home for past 6 months. Last two readings 167/96 & 152/96. MD aware. RN will continue to monitor.

## 2016-06-07 NOTE — Progress Notes (Addendum)
PROGRESS NOTE  Kirsten Avery TDD:[id number] DOB: [birth year] DOA: 06/03/2016 PCP: No primary care provider on file.  Brief History: 51 year old female with a history of hypertension and fibromyalgia presented with 2-3 day history of fevers, chills, and left lower extremity pain. The patient states that she began having intermittent headaches and sore throat that began on 05/29/2016 with some shortness of breath and nonproductive cough. She states that her headaches and sore throat overall have improved, but continued to have a nonproductive cough and shortness of breath. She denied any chest pain, hemoptysis, nausea, vomiting, diarrhea, abdominal pain, dysuria, hematuria. She denied any recent injury or trauma to her left lower extremity. She denies any recent travels. She has not had any unusual or undercooked foods. Shedoes not have any pets, nor has she had any sick contacts.Upon presentation, the patient was noted to have WBC 14.3 with lactic acid 1.61. Venous duplex of the left lower extremity was negative for DVT. CT angiogram chest was negative for pulmonary embolus. Influenza was negative and UA was negative for pyuria. The patient was started on empiric vancomycin. As WBC increased, meropenem was added. The patient ultimately defervesced. Unfortunately, the patient developed volume overload from fluid resuscitation. She was started on intravenous furosemide.  Assessment/Plan: Sepsis -Secondary to left lower extremity cellulitis/fasciitis -Continue vancomycin and meropenem -No fever in 24 hours -Continue IV fluids-->saline locked due to fluid overload -Urinalysis negative for pyuria -Follow blood cultures--neg to date -Influenza PCR negative--discontinue oseltamivir  Left lower extremity cellulitis/Fasciitis/Myositis -Concerned about deep-seated left lower extremity infection -MRI left lower extremity -Check CPK 451>>>293>>>148 -CRP 23.1, ESR 2 -Left lower extremity  duplex negative for DVT -06/05/2008 MRI LLE--abnormal edema along muscle margins of the gastrocnemius and soleus with involvement along the superficial fascial planes and deep fascial planes between muscles; low level enhancement of fascial planes; no soft tissue gas  -erythema, edema, pain slowly improving  Acute Respiratory Failure with hypoxia -due to hypoventilation/pulmonary edema/CHF -now on 3L [place] -start lasix 40 mg IV bid -06/06/16 CXR--personally reviewed--bilateral interstitial and patchy opacities, R>L -CT abd/pelvis-->non-obstructive--small bilateral pleural effusion R>L -Echo--  Acute CHF -although CT abd and CXR suggest PNA, pt is clinically volume overloaded with JVD -start lasix 40 mg IV bid -daily weights -pt has requested foley for aggressive diuresis -Echo  Elevated d-dimer -06/04/2016 CT angiogram chest--negative for PE; no pulmonary edema or infiltrates  Hypokalemia -Repleted -Check magnesium  Essential hypertension -Discontinue lisinopril/HCTZ secondary to soft blood pressure and renal insufficiency initially -hydralazine prn SBP >180 -anticipate improvement with diuresis  Fibromyalgia -Continue gabapentin and amitriptyline  Abdominal pain -CT abd/pelvis--multifocal patchy opacities with small bilateral pleural effusions, right greater than left; nonobstructive; no acute abnornmalities -improved -tolerating diet    Disposition Plan: Remain in stepdown Family Communication: Son and daughter updated at bedside--4/17  Consultants: none  Code Status: FULL   DVT Prophylaxis: [place] Lovenox   Procedures: As Listed in Progress Note Above  Antibiotics: Zosyn x 1 on 4/14 vanco 4/14>>> merrem 4/15>>>   Subjective: She continues to complain of shortness of breath but a little bit better than yesterday after furosemide IV. She denies any fevers, chills, chest pain, nausea no vomiting, diarrhea. Abdominal pain is improving. She  states that her left leg pain is still significant but slowly improving. She is able to lift her left leg now. Denies any headache or neck pain.  Objective: Vitals:   06/07/16 1000 06/07/16 1100 06/07/16 1147 06/07/16 1200  BP: Marland Kitchen)  145/97   (!) 167/97  Pulse: (!) 102 (!) 102  98  Resp: (!) 24 (!) 40  (!) 27  Temp:   98.3 F (36.8 C)   TempSrc:   Oral   SpO2: 95% 99%  100%  Weight:      Height:        Intake/Output Summary (Last 24 hours) at 06/07/16 1241 Last data filed at 06/07/16 1200  Gross per 24 hour  Intake              960 ml  Output             2200 ml  Net            -1240 ml   Weight change:  Exam:   General:  Pt is alert, follows commands appropriately, not in acute distress  HEENT: No icterus, No thrush, No neck mass, [place]/AT  Cardiovascular: RRR, S1/S2, no rubs, no gallops+JVD  Respiratory: Bibasilar crackles. No wheeze  Abdomen: Soft/+BS, non tender, non distended, no guarding  Extremities: 1 + RLE edema, No lymphangitis, No petechiae, No rashes, no synovitis; 2 + LLE with erythema from the infrapatellar area to the ankle. No synovitis of the left knee or left ankle.   Data Reviewed: I have personally reviewed following labs and imaging studies Basic Metabolic Panel:  Recent Labs Lab 06/04/16 0131 06/05/16 0358 06/06/16 0352 06/07/16 0315  NA 135 135 138 136  K 3.6 3.0* 3.5 3.5  CL 102 107 110 105  CO2 24 21* 21* 24  GLUCOSE 108* 126* 110* 105*  BUN '18 14 10 11  ' CREATININE 1.18* 1.23* 0.90 0.77  CALCIUM 9.2 7.2* 7.4* 7.7*   Liver Function Tests:  Recent Labs Lab 06/04/16 0131 06/06/16 0352  AST 34 28  ALT 31 24  ALKPHOS 73 57  BILITOT 1.1 2.0*  PROT 8.3* 6.0*  ALBUMIN 3.9 2.4*   No results for input(s): LIPASE, AMYLASE in the last 168 hours. No results for input(s): AMMONIA in the last 168 hours. Coagulation Profile: No results for input(s): INR, PROTIME in the last 168 hours. CBC:  Recent Labs Lab 06/04/16 0131  06/05/16 0358 06/06/16 0352 06/07/16 0315  WBC 14.3* 22.2* 22.5* 23.8*  NEUTROABS 13.0*  --  20.1*  --   HGB 12.7 9.9* 9.6* 9.6*  HCT 38.9 30.4* 29.4* 28.9*  MCV 86.6 85.9 85.5 83.8  PLT 254 198 205 223   Cardiac Enzymes:  Recent Labs Lab 06/05/16 1235 06/06/16 0352 06/07/16 0315  CKTOTAL 451* 293* 148   BNP: Invalid input(s): POCBNP CBG: No results for input(s): GLUCAP in the last 168 hours. HbA1C: No results for input(s): HGBA1C in the last 72 hours. Urine analysis:    Component Value Date/Time   COLORURINE YELLOW 06/04/2016 0201   APPEARANCEUR HAZY (A) 06/04/2016 0201   LABSPEC 1.015 06/04/2016 0201   PHURINE 5.0 06/04/2016 0201   GLUCOSEU NEGATIVE 06/04/2016 0201   HGBUR MODERATE (A) 06/04/2016 0201   BILIRUBINUR NEGATIVE 06/04/2016 0201   KETONESUR NEGATIVE 06/04/2016 0201   PROTEINUR NEGATIVE 06/04/2016 0201   UROBILINOGEN 1.0 02/04/2013 0014   NITRITE NEGATIVE 06/04/2016 0201   LEUKOCYTESUR MODERATE (A) 06/04/2016 0201   Sepsis Labs: '@[email or web address]' (procalcitonin:4,lacticidven:4) ) Recent Results (from the past 240 hour(s))  Blood Culture (routine x 2)     Status: None (Preliminary result)   Collection Time: 06/04/16  1:40 AM  Result Value Ref Range Status   Specimen Description BLOOD RIGHT ARM  Final   Special Requests  Final    BOTTLES DRAWN AEROBIC AND ANAEROBIC Blood Culture adequate volume   Culture   Final    NO GROWTH 3 DAYS Performed at [facility], [place] [address], [place], [place] [address]    Report Status PENDING  Incomplete  Blood Culture (routine x 2)     Status: None (Preliminary result)   Collection Time: 06/04/16  1:55 AM  Result Value Ref Range Status   Specimen Description BLOOD LEFT HAND  Final   Special Requests   Final    BOTTLES DRAWN AEROBIC AND ANAEROBIC Blood Culture adequate volume   Culture   Final    NO GROWTH 3 DAYS Performed at [facility], [place] [address] [place], [place] [address]    Report Status  PENDING  Incomplete  Culture, Urine     Status: Abnormal   Collection Time: 06/04/16  2:01 AM  Result Value Ref Range Status   Specimen Description URINE, RANDOM  Final   Special Requests NONE  Final   Culture MULTIPLE SPECIES PRESENT, SUGGEST RECOLLECTION (A)  Final   Report Status 06/05/2016 FINAL  Final  MRSA PCR Screening     Status: None   Collection Time: 06/04/16  5:09 PM  Result Value Ref Range Status   MRSA by PCR NEGATIVE NEGATIVE Final    Comment:        The GeneXpert MRSA Assay (FDA approved for NASAL specimens only), is one component of a comprehensive MRSA colonization surveillance program. It is not intended to diagnose MRSA infection nor to guide or monitor treatment for MRSA infections.      Scheduled Meds: . amitriptyline  50 mg Oral Daily  . docusate sodium  100 mg Oral BID  . enoxaparin (LOVENOX) injection  40 mg Subcutaneous Q24H  . furosemide  40 mg Intravenous BID  . gabapentin  300 mg Oral BID  . loratadine  10 mg Oral Daily  . nystatin  5 mL Oral QID  . potassium chloride  40 mEq Oral Daily  . sodium chloride flush  3 mL Intravenous Q12H   Continuous Infusions: . meropenem (MERREM) IV    . vancomycin      Procedures/Studies: Dg Chest 2 View  Result Date: 06/04/2016 CLINICAL DATA:  Fever EXAM: CHEST  2 VIEW COMPARISON:  chest radiograph 02/04/2013. FINDINGS: The heart size and mediastinal contours are within normal limits. Both lungs are clear. The visualized skeletal structures are unremarkable. IMPRESSION: No active cardiopulmonary disease. Electronically Signed   By: Ulyses Jarred M.D.   On: 06/04/2016 02:50   Ct Angio Chest Pe W Or Wo Contrast  Result Date: 06/04/2016 CLINICAL DATA:  Positive D-dimer, recent trauma 2 left leg, constant left leg and calf pain starting yesterday EXAM: CT ANGIOGRAPHY CHEST WITH CONTRAST TECHNIQUE: Multidetector CT imaging of the chest was performed using the standard protocol during bolus administration of  intravenous contrast. Multiplanar CT image reconstructions and MIPs were obtained to evaluate the vascular anatomy. CONTRAST:  100 cc Isovue COMPARISON:  None. FINDINGS: Cardiovascular: Cardiac size is within normal limits. No pericardial effusion. No aortic aneurysm. No pulmonary embolus is noted. There are artifacts from patient's large body habitus. Mediastinum/Nodes: No mediastinal hematoma or adenopathy. No hilar adenopathy is noted. Central airways are patent. Small hiatal hernia. Lungs/Pleura: Images of the lung parenchyma shows no infiltrate or pulmonary edema. Mild atelectasis noted bilateral lower lobe posteriorly. Upper Abdomen: The visualized upper abdomen is unremarkable. Musculoskeletal: No destructive bony lesions are noted. Sagittal images  of the spine shows mild degenerative changes mid and lower thoracic spine. Sagittal view of the sternum is unremarkable. Review of the MIP images confirms the above findings. IMPRESSION: 1. No definite pulmonary embolus is noted. There are artifacts from patient's large body habitus. 2. No mediastinal hematoma or adenopathy.  Small hiatal hernia. 3. No acute infiltrate or pulmonary edema. Dependent atelectasis noted bilateral lower lobe posteriorly. 4. Degenerative changes mid to lower thoracic spine. Electronically Signed   By: Lahoma Crocker M.D.   On: 06/04/2016 13:17   Ct Abdomen Pelvis W Contrast  Result Date: 06/06/2016 CLINICAL DATA:  Intermittent right lower quadrant pain, constipation x1 week EXAM: CT ABDOMEN AND PELVIS WITH CONTRAST TECHNIQUE: Multidetector CT imaging of the abdomen and pelvis was performed using the standard protocol following bolus administration of intravenous contrast. CONTRAST:  131m ISOVUE-300 IOPAMIDOL (ISOVUE-300) INJECTION 61% COMPARISON:  Chest radiographs dated 06/06/2016. CTA chest dated 06/04/2016. FINDINGS: Lower chest: Multifocal patchy opacities, right lower lobe predominant, suspicious for pneumonia. Small bilateral  pleural effusions, right greater than left. Hepatobiliary: Liver is within normal limits. Gallbladder is unremarkable. No intrahepatic or extrahepatic ductal dilatation. Pancreas: Within normal limits. Spleen: Within normal limits. Adrenals/Urinary Tract: Adrenal glands are within normal limits. Kidneys are within normal limits.  No hydronephrosis. Bladder is within normal limits Stomach/Bowel: Stomach is within normal limits. No evidence of bowel obstruction. Normal appendix (series 2/ image 66). Vascular/Lymphatic: No evidence of abdominal aortic aneurysm. No suspicious abdominopelvic lymphadenopathy. Reproductive: Uterus is within normal limits. Bilateral ovaries are within normal limits. Other: No abdominopelvic ascites. Musculoskeletal: Mild degenerative changes of the lower thoracic spine. IMPRESSION: No evidence of bowel obstruction.  Normal appendix. No CT findings to account for the patient's right lower quadrant abdominal pain. Multifocal patchy opacities, right lower lobe predominant, suspicious for pneumonia. Small bilateral pleural effusions, right greater than left. Electronically Signed   By: SJulian HyM.D.   On: 06/06/2016 14:27   Mr Tibia Fibula Left W Wo Contrast  Result Date: 06/05/2016 CLINICAL DATA:  Left leg and calf pain onset yesterday. Fibromyalgia. EXAM: MRI OF LOWER LEFT EXTREMITY WITHOUT AND WITH CONTRAST TECHNIQUE: Multiplanar, multisequence MR imaging of the left calf was performed both before and after administration of intravenous contrast. CONTRAST:  279mMULTIHANCE GADOBENATE DIMEGLUMINE 529 MG/ML IV SOLN COMPARISON:  06/04/2016 knee radiographs FINDINGS: Bones/Joint/Cartilage Small to moderate left knee effusion. There is only mild associated synovial enhancement, and accordingly I am skeptical of septic joint. Ligaments Today's imaging was performed with very large field of view and is not suitable for assessing small ligamentous structures. Muscles, Tendons, and soft  tissues Abnormal edema tracking along the gastrocnemius muscle margins (especially medially) and soleus muscle from the knee down to the distal tibial level, with involvement along both the superficial fascia planes and also along deep fascia planes between the muscles, with low-level associated enhancement along the fascia planes. No gas is tracking in the soft tissues. There is potentially some very subtle accentuated edema signal in the soleus and medial head gastrocnemius muscles, in a faint patchy distribution. Subcutaneous edema also tracks along the superficial fascia margin medially, anteriorly, and posteriorly in the calf. Anterior compartment does not appear involved. There is some expansion of the subcutaneous space on the left medial calf compared to the right. There appears to be some edema in the left distal thigh tracking around the iliotibial tract and biceps femoris, image 1/6. IMPRESSION: 1. Abnormal edema tracking along and to a lesser extent within the medial head  gastrocnemius, soleus, and lateral head gastrocnemius in the calf, with low-level associated enhancement. No definite gas in the soft tissues. However, this may reflect early fasciitis with low-level myositis. There is also some edema tracking along the distal iliotibial tract and distal biceps femoris musculotendinous junction in the knee. No anterior compartmental involvement. 2. Small knee effusion. I am skeptical of septic joint given the minimal degree of synovial enhancement. 3. Subcutaneous edema and low-level enhancement especially medially in the calf potentially from cellulitis. Electronically Signed   By: Van Clines M.D.   On: 06/05/2016 11:25   Dg Chest Port 1 View  Result Date: 06/06/2016 CLINICAL DATA:  51 year old female with dyspnea. Hypertension. Sepsis. Subsequent encounter. EXAM: PORTABLE CHEST 1 VIEW COMPARISON:  06/04/2016. FINDINGS: Marked change from prior examination with prominent asymmetric airspace  disease greater on the right. This may represent asymmetric pulmonary edema although infectious infiltrate cannot be excluded in proper clinical setting. No pneumothorax. Cardiomegaly. Gas distended bowel. IMPRESSION: Marked change from prior examination with prominent asymmetric airspace disease greater on the right. This may represent asymmetric pulmonary edema although infectious infiltrate cannot be excluded in proper clinical setting. Cardiomegaly. These results will be called to the ordering clinician or representative by the Radiologist Assistant, and communication documented in the PACS or zVision Dashboard. Electronically Signed   By: Genia Del M.D.   On: 06/06/2016 11:36   Dg Knee Complete 4 Views Left  Result Date: 06/04/2016 CLINICAL DATA:  Knee pain EXAM: LEFT KNEE - COMPLETE 4+ VIEW COMPARISON:  None. FINDINGS: No evidence of fracture, dislocation, or joint effusion. No evidence of arthropathy or other focal bone abnormality. Soft tissues are unremarkable. IMPRESSION: No fracture, dislocation or arthropathy of the left knee. Electronically Signed   By: Ulyses Jarred M.D.   On: 06/04/2016 02:51    Isobelle Tuckett, DO  [facility] Pager [phone]  If 7PM-7AM, please contact night-coverage [email or web address] Password [email or web address] 06/07/2016, 12:41 PM   LOS: 3 days

## 2016-06-07 NOTE — Progress Notes (Signed)
Pharmacy Antibiotic Note  Kirsten Avery is a 51 y.o. female admitted on 06/03/2016 with sepsis due to LE cellulitis.  Pharmacy has been consulted for Vancomycin and Zosyn dosing. TR[facility] has changed zosyn to meropenem 4/15 for concerns of deep-seeded infection  Today, 06/07/2016 Day #4 antibiotics  WBC continue to rise  Last fever 4/16  SCr normalized  Plan:  Continue vancomycin to  IV q12h  Check VT tonight (prior to 5th dose at current regimen)  Check daily SCr d/t elevated SCr  Meropenem dose appropriate as per physician orders  Height:  (157.5 cm) Weight: 236 lb 15.9 oz (107.5 kg) IBW/kg (Calculated) : 50.1  Temp (24hrs), Avg:98.2 F (36.8 C), Min:97.6 F (36.4 C), Max:98.6 F (37 C)   Recent Labs Lab 06/04/16 0113 06/04/16 0131 06/04/16 0542 06/05/16 0358 06/05/16 1235 06/05/16 1505 06/06/16 0352 06/07/16 0315  WBC  --  14.3*  --  22.2*  --   --  22.5* 23.8*  CREATININE  --  1.18*  --  1.23*  --   --  0.90 0.77  LATICACIDVEN 1.61  --  1.12  --  1.4 1.3  --   --     Estimated Creatinine Clearance: 97.1 mL/min (by C-G formula based on SCr of 0.77 mg/dL).    Allergies  Allergen Reactions  . Percocet [Oxycodone-Acetaminophen] Nausea Only    Tolerated with food  . Vicodin [Hydrocodone-Acetaminophen] Nausea Only    Tolerated with food    Antimicrobials this admission:  4/14 tamiflu >> 4/14 4/14 zosyn >> 4/14 4/14 vanco >> 4/15 meropenem >>  Dose adjustments this admission:  4/15 vanco 1gm q12 to  q12 per obesity nomogram and norm CrCl = 34ml/min  Microbiology results:  4/14 flu panel: neg 4/14 BCx: ngtd 4/14 UCx: mx spp  4/14 MRSA PCR: neg  Thank you for allowing pharmacy to be a part of this patient's care.  Bernadene Person, PharmD, BCPS Pager: [phone] 06/07/2016, 3:07 PM

## 2016-06-08 ENCOUNTER — Inpatient Hospital Stay (HOSPITAL_COMMUNITY): Payer: Medicare HMO

## 2016-06-08 DIAGNOSIS — M729 Fibroblastic disorder, unspecified: Secondary | ICD-10-CM

## 2016-06-08 DIAGNOSIS — J810 Acute pulmonary edema: Secondary | ICD-10-CM

## 2016-06-08 DIAGNOSIS — I509 Heart failure, unspecified: Secondary | ICD-10-CM

## 2016-06-08 DIAGNOSIS — J9601 Acute respiratory failure with hypoxia: Secondary | ICD-10-CM

## 2016-06-08 DIAGNOSIS — A419 Sepsis, unspecified organism: Principal | ICD-10-CM

## 2016-06-08 DIAGNOSIS — R7989 Other specified abnormal findings of blood chemistry: Secondary | ICD-10-CM

## 2016-06-08 DIAGNOSIS — L03116 Cellulitis of left lower limb: Secondary | ICD-10-CM

## 2016-06-08 DIAGNOSIS — E876 Hypokalemia: Secondary | ICD-10-CM

## 2016-06-08 LAB — BASIC METABOLIC PANEL
Anion gap: 10 (ref 5–15)
BUN: 12 mg/dL (ref 6–20)
CHLORIDE: 99 mmol/L — AB (ref 101–111)
CO2: 25 mmol/L (ref 22–32)
Calcium: 7.9 mg/dL — ABNORMAL LOW (ref 8.9–10.3)
Creatinine, Ser: 0.71 mg/dL (ref 0.44–1.00)
GFR calc Af Amer: >60 mL/min (ref >60)
GFR calc non Af Amer: >60 mL/min (ref >60)
GLUCOSE: 88 mg/dL (ref 65–99)
POTASSIUM: 3.6 mmol/L (ref 3.5–5.1)
SODIUM: 134 mmol/L — AB (ref 135–145)

## 2016-06-08 LAB — CBC
HEMATOCRIT: 28.4 % — AB (ref 36.0–46.0)
HEMOGLOBIN: 9.5 g/dL — AB (ref 12.0–15.0)
MCH: 28.1 pg (ref 26.0–34.0)
MCHC: 33.5 g/dL (ref 30.0–36.0)
MCV: 84 fL (ref 78.0–100.0)
Platelets: 258 10*3/uL (ref 150–400)
RBC: 3.38 MIL/uL — ABNORMAL LOW (ref 3.87–5.11)
RDW: 16 % — ABNORMAL HIGH (ref 11.5–15.5)
WBC: 18.1 10*3/uL — ABNORMAL HIGH (ref 4.0–10.5)

## 2016-06-08 LAB — ECHOCARDIOGRAM COMPLETE
HEIGHTINCHES: 62 in
WEIGHTICAEL: 3784.86 [oz_av]

## 2016-06-08 LAB — PROCALCITONIN: Procalcitonin: 1.55 ng/mL

## 2016-06-08 LAB — MAGNESIUM: MAGNESIUM: 1.8 mg/dL (ref 1.7–2.4)

## 2016-06-08 MED ORDER — VITAMINS A & D EX OINT
TOPICAL_OINTMENT | CUTANEOUS | Status: AC
  Administered 2016-06-08: 19:00:00
  Filled 2016-06-08: qty 5

## 2016-06-08 NOTE — Progress Notes (Signed)
Received form ICU, alert and oriented, daughter at bedside. agree with previous RN's assessment

## 2016-06-08 NOTE — Progress Notes (Signed)
PROGRESS NOTE  Kirsten Avery  MIW:[id number] DOB: Jun 29, 1965 DOA: 06/03/2016 PCP: No primary care provider on file.   Brief Narrative: Kirsten Avery is a 51 y.o. female with a history of HTN and fibromyalgia who presented with 2-3 day history of fevers, chills, and left lower extremity pain. The patient states that she began having intermittent headaches and sore throat that began on 05/29/2016 with some shortness of breath and nonproductive cough. She states that her headaches and sore throat overall have improved, but continued to have a nonproductive cough and shortness of breath. She denied any chest pain, hemoptysis, nausea, vomiting, diarrhea, abdominal pain, dysuria, hematuria. She denied any recent injury or trauma to her left lower extremity.   Upon presentation, the patient was noted to have WBC 14.3 with lactic acid 1.61. Venous duplex of the left lower extremity was negative for DVT. CT angiogram chest was negative for pulmonary embolus.Influenza was negative and UA was negative for pyuria. The patient was started on empiric vancomycin. As WBC increased, meropenem was added. MR of the LLE demonstrated changes consistent with early fasciitis and low-level myositis. CPK was elevated and she was given IV fluids. The patient ultimately defervesced. Unfortunately, the patient developed volume overload from fluid resuscitation with pulmonary edema. She was started on intravenous furosemide.  Assessment & Plan: Active Problems:   Blind   Sepsis ([place])   Cellulitis, leg   Fever   Hypokalemia   Benign essential HTN   Fasciitis   Acute CHF ([place])  Sepsis: Secondary to left lower extremity cellulitis/fasciitis. Blood cultures from 4/13 no growth. - Continue vancomycin and meropenem - Monitor leukocytosis with CBC daily  Left lower extremity cellulitis/Fasciitis/Myositis: 06/05/2016 MRI LLE--abnormal edema along muscle margins of the gastrocnemius and soleus with involvement along  the superficial fascial planes and deep fascial planes between muscles;low level enhancement of fascial planes; no soft tissue gas Duplex U/S negative for DVT. - CPK 451>>>293>>>148 on 4/17 - CRP 23.1 > 26.1, ESR 2 - Erythema, edema, pain slowly improving  Acute respiratory failure with hypoxia: Due to pulmonary edema from volume resuscitation for sepsis. CXR also showed small pleural effusions R>L. Although CT abd and CXR suggest PNA, pt is clinically volume overloaded with JVD - Started lasix 63m IV BID on 4/17. Significant UOP on 4/17 (5.2L).  - Monitor daily weights, strict I/O - Wean oxygen as able - Awaiting echocardiogram results.  Elevated d-dimer: 06/04/2016 CT angiogram chest--negative for PE; no pulmonary edema or infiltrates  Hypokalemia: Repleted and resolved. Mg wnl.  Essential hypertension: Discontinued lisinopril/HCTZ secondary to soft blood pressure and renal insufficiency initially. Normotensive while holding these still. - Continue hydralazine prn SBP >180  Fibromyalgia: Chronic, stable.  - Continue gabapentin and amitriptyline  Abdominal pain: CT abd/pelvis--multifocal patchy opacities with small bilateral pleural effusions, right greater than left; nonobstructive; no acute abnornmalities. Since improved, tolerating diet  DVT prophylaxis: Lovenox Code Status: Full Family Communication: None at bedside. Disposition Plan: Transfer to floor today.   Consultants:   None  Procedures:   None  Antimicrobials: Zosyn x 1 on 4/14 vancomycin 4/14>>> meropenem 4/15>>>  Subjective: Patient reporting stable left lower leg pain improved from admission, worst with weight bearing. Reports chronic fibromyalgia pain at baseline. No fevers in 36 hours.   Objective: Vitals:   06/08/16 0800 06/08/16 0900 06/08/16 1000 06/08/16 1150  BP: 128/84  132/76   Pulse: 94 96 97   Resp: (!) 32 (!) 36 (!) 21   Temp: 98.4 F (36.9 C)  98.1 F (36.7 C)  TempSrc: Oral    Oral  SpO2: 97% 96% 97%   Weight:      Height:        Intake/Output Summary (Last 24 hours) at 06/08/16 1525 Last data filed at 06/08/16 1000  Gross per 24 hour  Intake              950 ml  Output             6100 ml  Net            -5150 ml   Filed Weights   06/04/16 0529 06/04/16 1703 06/08/16 0500  Weight: 102.1 kg (225 lb) 107.5 kg (236 lb 15.9 oz) 107.3 kg (236 lb 8.9 oz)    Examination: General exam: Obese 51 y.o. female in no distress  Respiratory system: Non-labored breathing 2L by [place]. Bibasilar crackles.  Cardiovascular system: Regular rate and rhythm. No murmur, rub, or gallop. No JVD, and trace pedal edema. Gastrointestinal system: Abdomen soft, non-tender, non-distended, with normoactive bowel sounds. No organomegaly or masses felt. Central nervous system: Alert and oriented. No focal neurological deficits. Extremities: LLE with 1+ edema below knee, splotchy erythema extending irregularly circumferentially on left calf not involving knee or foot, tender to palpation with focal annular induration on posterior/medial calf. Psychiatry: Judgement and insight appear normal. Mood & affect appropriate.   Data Reviewed: I have personally reviewed following labs and imaging studies  CBC:  Recent Labs Lab 06/04/16 0131 06/05/16 0358 06/06/16 0352 06/07/16 0315 06/08/16 0413  WBC 14.3* 22.2* 22.5* 23.8* 18.1*  NEUTROABS 13.0*  --  20.1*  --   --   HGB 12.7 9.9* 9.6* 9.6* 9.5*  HCT 38.9 30.4* 29.4* 28.9* 28.4*  MCV 86.6 85.9 85.5 83.8 84.0  PLT 254 198 205 223 938   Basic Metabolic Panel:  Recent Labs Lab 06/04/16 0131 06/05/16 0358 06/06/16 0352 06/07/16 0315 06/08/16 0320  NA 135 135 138 136 134*  K 3.6 3.0* 3.5 3.5 3.6  CL 102 107 110 105 99*  CO2 24 21* 21* 24 25  GLUCOSE 108* 126* 110* 105* 88  BUN _0 CREATININE 1.18* 1.23* 0.90 0.77 0.71  CALCIUM 9.2 7.2* 7.4* 7.7* 7.9*  MG  --   --   --   --  1.8   GFR: Estimated Creatinine  Clearance: 97 mL/min (by C-G formula based on SCr of 0.71 mg/dL). Liver Function Tests:  Recent Labs Lab 06/04/16 0131 06/06/16 0352  AST 34 28  ALT 31 24  ALKPHOS 73 57  BILITOT 1.1 2.0*  PROT 8.3* 6.0*  ALBUMIN 3.9 2.4*   No results for input(s): LIPASE, AMYLASE in the last 168 hours. No results for input(s): AMMONIA in the last 168 hours. Coagulation Profile: No results for input(s): INR, PROTIME in the last 168 hours. Cardiac Enzymes:  Recent Labs Lab 06/05/16 1235 06/06/16 0352 06/07/16 0315  CKTOTAL 451* 293* 148   BNP (last 3 results) No results for input(s): PROBNP in the last 8760 hours. HbA1C: No results for input(s): HGBA1C in the last 72 hours. CBG: No results for input(s): GLUCAP in the last 168 hours. Lipid Profile: No results for input(s): CHOL, HDL, LDLCALC, TRIG, CHOLHDL, LDLDIRECT in the last 72 hours. Thyroid Function Tests: No results for input(s): TSH, T4TOTAL, FREET4, T3FREE, THYROIDAB in the last 72 hours. Anemia Panel: No results for input(s): VITAMINB12, FOLATE, FERRITIN, TIBC, IRON, RETICCTPCT in the last 72 hours. Urine analysis:  Component Value Date/Time   COLORURINE YELLOW 06/04/2016 0201   APPEARANCEUR HAZY (A) 06/04/2016 0201   LABSPEC 1.015 06/04/2016 0201   PHURINE 5.0 06/04/2016 0201   GLUCOSEU NEGATIVE 06/04/2016 0201   HGBUR MODERATE (A) 06/04/2016 0201   BILIRUBINUR NEGATIVE 06/04/2016 0201   KETONESUR NEGATIVE 06/04/2016 0201   PROTEINUR NEGATIVE 06/04/2016 0201   UROBILINOGEN 1.0 02/04/2013 0014   NITRITE NEGATIVE 06/04/2016 0201   LEUKOCYTESUR MODERATE (A) 06/04/2016 0201   Recent Results (from the past 240 hour(s))  Blood Culture (routine x 2)     Status: None (Preliminary result)   Collection Time: 06/04/16  1:40 AM  Result Value Ref Range Status   Specimen Description BLOOD RIGHT ARM  Final   Special Requests   Final    BOTTLES DRAWN AEROBIC AND ANAEROBIC Blood Culture adequate volume   Culture   Final    NO  GROWTH 4 DAYS Performed at [facility], [place] [address] [place], [place] [address]    Report Status PENDING  Incomplete  Blood Culture (routine x 2)     Status: None (Preliminary result)   Collection Time: 06/04/16  1:55 AM  Result Value Ref Range Status   Specimen Description BLOOD LEFT HAND  Final   Special Requests   Final    BOTTLES DRAWN AEROBIC AND ANAEROBIC Blood Culture adequate volume   Culture   Final    NO GROWTH 4 DAYS Performed at [facility], [place] [address] [place], [place] [address]    Report Status PENDING  Incomplete  Culture, Urine     Status: Abnormal   Collection Time: 06/04/16  2:01 AM  Result Value Ref Range Status   Specimen Description URINE, RANDOM  Final   Special Requests NONE  Final   Culture MULTIPLE SPECIES PRESENT, SUGGEST RECOLLECTION (A)  Final   Report Status 06/05/2016 FINAL  Final  MRSA PCR Screening     Status: None   Collection Time: 06/04/16  5:09 PM  Result Value Ref Range Status   MRSA by PCR NEGATIVE NEGATIVE Final    Comment:        The GeneXpert MRSA Assay (FDA approved for NASAL specimens only), is one component of a comprehensive MRSA colonization surveillance program. It is not intended to diagnose MRSA infection nor to guide or monitor treatment for MRSA infections.       Radiology Studies: No results found.  Scheduled Meds: . amitriptyline  50 mg Oral Daily  . docusate sodium  100 mg Oral BID  . enoxaparin (LOVENOX) injection  40 mg Subcutaneous Q24H  . furosemide  40 mg Intravenous BID  . gabapentin  300 mg Oral BID  . loratadine  10 mg Oral Daily  . nystatin  5 mL Oral QID  . potassium chloride  40 mEq Oral Daily  . sodium chloride flush  3 mL Intravenous Q12H   Continuous Infusions: . meropenem (MERREM) IV    . vancomycin Stopped (06/08/16 0956)     LOS: 4 days   Time spent: 25 minutes.  Vance Gather, MD [facility] Pager [phone]  If 7PM-7AM, please contact  night-coverage [email or web address] Password [facility] 06/08/2016, 3:25 PM

## 2016-06-08 NOTE — Progress Notes (Signed)
  Echocardiogram 2D Echocardiogram has been performed.  Nolon Rod 06/08/2016, 3:27 PM

## 2016-06-09 DIAGNOSIS — Z888 Allergy status to other drugs, medicaments and biological substances status: Secondary | ICD-10-CM

## 2016-06-09 DIAGNOSIS — I10 Essential (primary) hypertension: Secondary | ICD-10-CM

## 2016-06-09 DIAGNOSIS — R509 Fever, unspecified: Secondary | ICD-10-CM

## 2016-06-09 DIAGNOSIS — I509 Heart failure, unspecified: Secondary | ICD-10-CM

## 2016-06-09 DIAGNOSIS — Z79899 Other long term (current) drug therapy: Secondary | ICD-10-CM

## 2016-06-09 LAB — CULTURE, BLOOD (ROUTINE X 2)
CULTURE: NO GROWTH
CULTURE: NO GROWTH
SPECIAL REQUESTS: ADEQUATE
Special Requests: ADEQUATE

## 2016-06-09 LAB — BASIC METABOLIC PANEL
Anion gap: 9 (ref 5–15)
BUN: 12 mg/dL (ref 6–20)
CALCIUM: 8.1 mg/dL — AB (ref 8.9–10.3)
CO2: 31 mmol/L (ref 22–32)
CREATININE: 0.64 mg/dL (ref 0.44–1.00)
Chloride: 99 mmol/L — ABNORMAL LOW (ref 101–111)
GFR calc non Af Amer: >60 mL/min (ref >60)
Glucose, Bld: 95 mg/dL (ref 65–99)
Potassium: 3.5 mmol/L (ref 3.5–5.1)
SODIUM: 139 mmol/L (ref 135–145)

## 2016-06-09 LAB — CBC
HCT: 28.4 % — ABNORMAL LOW (ref 36.0–46.0)
Hemoglobin: 9.2 g/dL — ABNORMAL LOW (ref 12.0–15.0)
MCH: 27 pg (ref 26.0–34.0)
MCHC: 32.4 g/dL (ref 30.0–36.0)
MCV: 83.3 fL (ref 78.0–100.0)
Platelets: 312 10*3/uL (ref 150–400)
RBC: 3.41 MIL/uL — ABNORMAL LOW (ref 3.87–5.11)
RDW: 16 % — AB (ref 11.5–15.5)
WBC: 18.1 10*3/uL — ABNORMAL HIGH (ref 4.0–10.5)

## 2016-06-09 MED ORDER — SODIUM CHLORIDE 0.9 % IV SOLN
600.0000 mg | Freq: Two times a day (BID) | INTRAVENOUS | Status: DC
  Administered 2016-06-09 – 2016-06-13 (×8): 600 mg via INTRAVENOUS
  Filled 2016-06-09 (×9): qty 600

## 2016-06-09 MED ORDER — FENTANYL CITRATE (PF) 100 MCG/2ML IJ SOLN
50.0000 ug | INTRAMUSCULAR | Status: DC | PRN
  Administered 2016-06-09: 50 ug via INTRAVENOUS
  Filled 2016-06-09: qty 2

## 2016-06-09 NOTE — Evaluation (Signed)
Physical Therapy Evaluation Patient Details Name: Kirsten Avery MRN: [id number] DOB: 18-Oct-1965 Today's Date: 06/09/2016   History of Present Illness  50 y.o. female with a history of HTN, fibromyalgia, blindness and admitted with sepsis, Left lower extremity cellulitis/Fasciitis/Myositis  Clinical Impression  Pt admitted with above diagnosis. Pt currently with functional limitations due to the deficits listed below (see PT Problem List).  Pt will benefit from skilled PT to increase their independence and safety with mobility to allow discharge to the venue listed below.  Pt very concerned that she has not been OOB since admission and mobility limited today due to increased L LE pain and weakness.  Pt would benefit from f/u post acute rehab.  Pt states she her bedroom is on second level, and she would not be able to stay on main level.  Pt attempted to stand a couple times however unable to reach complete upright position.     Follow Up Recommendations CIR    Equipment Recommendations  Rolling walker with 5" wheels;Hospital bed    Recommendations for Other Services       Precautions / Restrictions Precautions Precautions: Fall Precaution Comments: pt is blind      Mobility  Bed Mobility Overal bed mobility: Needs Assistance Bed Mobility: Supine to Sit;Sit to Supine     Supine to sit: Min assist;HOB elevated Sit to supine: Mod assist   General bed mobility comments: pt utilized rail to self assist, assist for L LE, more assist required for return to supine  Transfers Overall transfer level: Needs assistance Equipment used: Rolling walker (2 wheeled) Transfers: Sit to/from Stand Sit to Stand: Mod assist         General transfer comment: pt unable to complete rise however cleared bed surface, attempted twice, limited by pain and weakness  Ambulation/Gait                Stairs            Wheelchair Mobility    Modified Rankin (Stroke Patients  Only)       Balance Overall balance assessment: Needs assistance Sitting-balance support: Feet supported Sitting balance-Leahy Scale: Fair                                       Pertinent Vitals/Pain Pain Assessment: Faces Faces Pain Scale: Hurts worst Pain Location: L lower leg Pain Descriptors / Indicators: Sore;Tightness;Grimacing;Crying Pain Intervention(s): Limited activity within patient's tolerance;Monitored during session;Repositioned;Patient requesting pain meds-RN notified    Home Living Family/patient expects to be discharged to:: Private residence Living Arrangements: Children           Home Layout: Two level Home Equipment: None Additional Comments: pt's bedroom upstairs, not able to stay on main level    Prior Function Level of Independence: Independent               Hand Dominance        Extremity/Trunk Assessment        Lower Extremity Assessment Lower Extremity Assessment: Generalized weakness;LLE deficits/detail LLE Deficits / Details: increased lower leg edema, tender to touch, assist required for bed mobility LLE: Unable to fully assess due to pain       Communication   Communication: No difficulties  Cognition Arousal/Alertness: Awake/alert Behavior During Therapy: WFL for tasks assessed/performed  General Comments: pt upset during session about her son - requested CSW speak with pt      General Comments      Exercises     Assessment/Plan    PT Assessment Patient needs continued PT services  PT Problem List Decreased strength;Decreased activity tolerance;Decreased knowledge of use of DME;Decreased mobility;Pain       PT Treatment Interventions DME instruction;Gait training;Therapeutic exercise;Therapeutic activities;Patient/family education;Wheelchair mobility training;Functional mobility training    PT Goals (Current goals can be found in the Care Plan  section)  Acute Rehab PT Goals PT Goal Formulation: With patient Time For Goal Achievement: 06/16/16 Potential to Achieve Goals: Good    Frequency Min 3X/week   Barriers to discharge        Co-evaluation               End of Session Equipment Utilized During Treatment: Oxygen Activity Tolerance: Patient limited by pain Patient left: in bed;with call bell/phone within reach;with bed alarm set Nurse Communication: Patient requests pain meds PT Visit Diagnosis: Difficulty in walking, not elsewhere classified (R26.2);Pain Pain - Right/Left: Left Pain - part of body: Leg    Time: 1610-9604 PT Time Calculation (min) (ACUTE ONLY): 28 min   Charges:   PT Evaluation $PT Eval Moderate Complexity: 1 Procedure PT Treatments $Therapeutic Activity: 8-22 mins   PT G Codes:        Zenovia Jarred, PT, DPT 06/09/2016 Pager: [phone]   Maida Sale E 06/09/2016, 2:59 PM

## 2016-06-09 NOTE — Consult Note (Signed)
[facility]       Reason for Consult: cellulitis    Referring Physician: Dr. Jarvis Newcomer  Active Problems:   Blind   Sepsis ([facility])   Cellulitis, leg   Fever   Hypokalemia   Benign essential HTN   Fasciitis   Acute CHF ([facility])   . amitriptyline  50 mg Oral Daily  . docusate sodium  100 mg Oral BID  . enoxaparin (LOVENOX) injection  40 mg Subcutaneous Q24H  . furosemide  40 mg Intravenous BID  . gabapentin  300 mg Oral BID  . loratadine  10 mg Oral Daily  . nystatin  5 mL Oral QID  . potassium chloride  40 mEq Oral Daily  . sodium chloride flush  3 mL Intravenous Q12H    Recommendations: I will change to Teflaro   Assessment: She has cellulitis on her left leg, some improvement but slow to improve.  I will have her continue with IV therapy (with Teflaro) and if continues to improve, can d/c on doxycycline and keflex.  I do not think broad coverage with meropenem is indicated, likely just slow to respond due to obesity.   She is also asking about more pain control.    HIV negative  Antibiotics: Vancomycin and meropenem  HPI: Kirsten Avery is a 51 y.o. female with HTN, obesity, came in with fever, chills and left leg swelling c/w cellulitis.  No history of this before.  WBC at 14 and had increased up to 23, though down to 18 today.  Has made slow improvement clinically per her family member in the room.  Fever curve has trended down.  No associated rash, n/v/d.  WBC curve down.  History reviewed and did have left foot pain but no cellultis noted at the time.    Review of Systems:  Constitutional: negative for fevers and chills Respiratory: negative for cough Gastrointestinal: negative for diarrhea Integument/breast: negative for rash All other systems reviewed and are negative    Past Medical History:  Diagnosis Date  . Blind   . Fibromyalgia   . Hypertension     Social History  Substance Use Topics  . Smoking status: Never Smoker    . Smokeless tobacco: Never Used  . Alcohol use No    No family history on file.  Allergies  Allergen Reactions  . Percocet [Oxycodone-Acetaminophen] Nausea Only    Tolerated with food  . Vicodin [Hydrocodone-Acetaminophen] Nausea Only    Tolerated with food    Physical Exam: Constitutional: in no apparent distress and alert  Vitals:   06/08/16 2215 06/09/16 0508  BP: 113/64 138/80  Pulse: 99 90  Resp: 20 20  Temp: 98.3 F (36.8 C) 98.2 F (36.8 C)   EYES: anicteric ENMT: no thrush Cardiovascular: Cor RRR Respiratory: CTA B; normal respiratory effort GI: Bowel sounds are normal, liver is not enlarged, spleen is not enlarged, soft, nt Musculoskeletal: left leg with warmth, redness, tenderness Skin: negatives: no other rash   Lab Results  Component Value Date   WBC 18.1 (H) 06/09/2016   HGB 9.2 (L) 06/09/2016   HCT 28.4 (L) 06/09/2016   MCV 83.3 06/09/2016   PLT 312 06/09/2016    Lab Results  Component Value Date   CREATININE 0.64 06/09/2016   BUN 12 06/09/2016   NA 139 06/09/2016   K 3.5 06/09/2016   CL 99 (L) 06/09/2016   CO2 31 06/09/2016    Lab Results  Component Value Date  ALT 24 06/06/2016   AST 28 06/06/2016   ALKPHOS 57 06/06/2016     Microbiology: Recent Results (from the past 240 hour(s))  Blood Culture (routine x 2)     Status: None   Collection Time: 06/04/16  1:40 AM  Result Value Ref Range Status   Specimen Description BLOOD RIGHT ARM  Final   Special Requests   Final    BOTTLES DRAWN AEROBIC AND ANAEROBIC Blood Culture adequate volume   Culture   Final    NO GROWTH 5 DAYS Performed at [facility], [address] [place], [place] [address]    Report Status 06/09/2016 FINAL  Final  Blood Culture (routine x 2)     Status: None   Collection Time: 06/04/16  1:55 AM  Result Value Ref Range Status   Specimen Description BLOOD LEFT HAND  Final   Special Requests   Final    BOTTLES DRAWN AEROBIC AND ANAEROBIC Blood Culture  adequate volume   Culture   Final    NO GROWTH 5 DAYS Performed at [facility], [address] [place], [place] [address]    Report Status 06/09/2016 FINAL  Final  Culture, Urine     Status: Abnormal   Collection Time: 06/04/16  2:01 AM  Result Value Ref Range Status   Specimen Description URINE, RANDOM  Final   Special Requests NONE  Final   Culture MULTIPLE SPECIES PRESENT, SUGGEST RECOLLECTION (A)  Final   Report Status 06/05/2016 FINAL  Final  MRSA PCR Screening     Status: None   Collection Time: 06/04/16  5:09 PM  Result Value Ref Range Status   MRSA by PCR NEGATIVE NEGATIVE Final    Comment:        The GeneXpert MRSA Assay (FDA approved for NASAL specimens only), is one component of a comprehensive MRSA colonization surveillance program. It is not intended to diagnose MRSA infection nor to guide or monitor treatment for MRSA infections.     Staci Righter, MD [facility] [email or web address] [phone] pager  [phone] cell 06/09/2016, 4:37 PM

## 2016-06-09 NOTE — Progress Notes (Signed)
PROGRESS NOTE  Kirsten Avery  WUJ:[id number] DOB: 07-17-1965 DOA: 06/03/2016 PCP: No primary care provider on file.   Brief Narrative: Kirsten Avery is a 51 y.o. female with a history of HTN and fibromyalgia who presented with 2-3 day history of fevers, chills, and left lower extremity pain. The patient states that she began having intermittent headaches and sore throat that began on 05/29/2016 with some shortness of breath and nonproductive cough. She states that her headaches and sore throat overall have improved, but continued to have a nonproductive cough and shortness of breath. She denied any chest pain, hemoptysis, nausea, vomiting, diarrhea, abdominal pain, dysuria, hematuria. She denied any recent injury or trauma to her left lower extremity.   Upon presentation, the patient was noted to have WBC 14.3 with lactic acid 1.61. There was evidence of left leg cellulitis. Venous duplex of the left lower extremity was negative for DVT. CT angiogram chest was negative for pulmonary embolus.Influenza was negative and UA was negative for pyuria. The patient was started on empiric vancomycin. As WBC increased, meropenem was added. MR of the LLE demonstrated changes consistent with early fasciitis and low-level myositis. CPK was elevated and she was given IV fluids. The patient ultimately defervesced and CPK normalized. The patient developed volume overload from fluid resuscitation with pulmonary edema which was treated with lasix with improvement.   Assessment & Plan: Active Problems:   Blind   Sepsis ([place])   Cellulitis, leg   Fever   Hypokalemia   Benign essential HTN   Fasciitis   Acute CHF ([place])  Sepsis: Secondary to left lower extremity cellulitis/fasciitis. Blood cultures from 4/13 no growth. - Continue vancomycin and meropenem - Monitor leukocytosis with CBC daily - Will ask for ID opinion given failure to improve.   Left lower extremity cellulitis/Fasciitis/Myositis:  06/05/2016 MRI LLE--abnormal edema along muscle margins of the gastrocnemius and soleus with involvement along the superficial fascial planes and deep fascial planes between muscles;low level enhancement of fascial planes; no soft tissue gas Duplex U/S negative for DVT. - CPK 451>>>293>>>148 on 4/17 - CRP 23.1 > 26.1, ESR 2 - Erythema, edema, pain, leukocytosis stable.  Acute respiratory failure with hypoxia: Due to pulmonary edema from volume resuscitation for sepsis. CXR also showed small pleural effusions R>L. Although CT abd and CXR suggest PNA, pt was clinically volume overloaded. CT angiogram chest--negative for PE; no pulmonary edema or infiltrates. Echocardiogram essentially normal.  - Started lasix 57m IV BID on 4/17. Significant UOP, creatinine improving. Will continue for 1 more day.  - Monitor daily weights, strict I/O - Wean oxygen as able  Hypokalemia: Repleted and resolved. Mg wnl.  Essential hypertension: Discontinued lisinopril/HCTZ secondary to soft blood pressure and renal insufficiency initially. Normotensive while holding these still. - Continue hydralazine prn SBP >180  Fibromyalgia: Chronic, stable.  - Continue gabapentin and amitriptyline  Abdominal pain: CT abd/pelvis--multifocal patchy opacities with small bilateral pleural effusions, right greater than left; nonobstructive; no acute abnornmalities. Since improved, tolerating diet  DVT prophylaxis: Lovenox Code Status: Full Family Communication: Daughter at bedside Disposition Plan: Continue IV abx, lab monitoring. Therapy consult pending.  Consultants:   ID, Dr. CLinus Salmons Procedures:   None  Antimicrobials: Zosyn x 1 on 4/14 vancomycin 4/14>>> meropenem 4/15>>>  Subjective: Pain is stable. She reports improvement in dyspnea, no chest pain. No fevers.  Objective: Vitals:   06/08/16 1700 06/08/16 1900 06/08/16 2215 06/09/16 0508  BP:  (!) 144/83 113/64 138/80  Pulse: (!) 103 100 99 90  Resp:  (!) '31 20 20 20  ' Temp:  98 F (36.7 C) 98.3 F (36.8 C) 98.2 F (36.8 C)  TempSrc:  Oral Oral Oral  SpO2: 100% 93% 97% 100%  Weight:    106.3 kg (234 lb 5.6 oz)  Height:        Intake/Output Summary (Last 24 hours) at 06/09/16 1503 Last data filed at 06/09/16 1100  Gross per 24 hour  Intake              100 ml  Output             2650 ml  Net            -2550 ml   Filed Weights   06/04/16 1703 06/08/16 0500 06/09/16 0508  Weight: 107.5 kg (236 lb 15.9 oz) 107.3 kg (236 lb 8.9 oz) 106.3 kg (234 lb 5.6 oz)    Examination: General exam: Obese 51 y.o. female in no distress  Respiratory system: Non-labored breathing 2L by [place]. Bibasilar crackles improved, faint this AM. Cardiovascular system: Regular rate and rhythm. No murmur, rub, or gallop. No JVD, and trace pedal edema. Gastrointestinal system: Abdomen soft, non-tender, non-distended, with normoactive bowel sounds. No organomegaly or masses felt. Central nervous system: Alert and oriented. No focal neurological deficits. Extremities: LLE with 1+ edema below knee, splotchy erythema extending irregularly circumferentially on left calf not involving knee or foot, tender to palpation with focal annular induration on posterior/medial calf. Psychiatry: Judgement and insight appear normal. Mood & affect appropriate.   Data Reviewed: I have personally reviewed following labs and imaging studies  CBC:  Recent Labs Lab 06/04/16 0131 06/05/16 0358 06/06/16 0352 06/07/16 0315 06/08/16 0413 06/09/16 0509  WBC 14.3* 22.2* 22.5* 23.8* 18.1* 18.1*  NEUTROABS 13.0*  --  20.1*  --   --   --   HGB 12.7 9.9* 9.6* 9.6* 9.5* 9.2*  HCT 38.9 30.4* 29.4* 28.9* 28.4* 28.4*  MCV 86.6 85.9 85.5 83.8 84.0 83.3  PLT 254 198 205 223 258 834   Basic Metabolic Panel:  Recent Labs Lab 06/05/16 0358 06/06/16 0352 06/07/16 0315 06/08/16 0320 06/09/16 0509  NA 135 138 136 134* 139  K 3.0* 3.5 3.5 3.6 3.5  CL 107 110 105 99* 99*  CO2 21* 21*  '24 25 31  ' GLUCOSE 126* 110* 105* 88 95  BUN '14 10 11 12 12  ' CREATININE 1.23* 0.90 0.77 0.71 0.64  CALCIUM 7.2* 7.4* 7.7* 7.9* 8.1*  MG  --   --   --  1.8  --    GFR: Estimated Creatinine Clearance: 96.4 mL/min (by C-G formula based on SCr of 0.64 mg/dL). Liver Function Tests:  Recent Labs Lab 06/04/16 0131 06/06/16 0352  AST 34 28  ALT 31 24  ALKPHOS 73 57  BILITOT 1.1 2.0*  PROT 8.3* 6.0*  ALBUMIN 3.9 2.4*   No results for input(s): LIPASE, AMYLASE in the last 168 hours. No results for input(s): AMMONIA in the last 168 hours. Coagulation Profile: No results for input(s): INR, PROTIME in the last 168 hours. Cardiac Enzymes:  Recent Labs Lab 06/05/16 1235 06/06/16 0352 06/07/16 0315  CKTOTAL 451* 293* 148   BNP (last 3 results) No results for input(s): PROBNP in the last 8760 hours. HbA1C: No results for input(s): HGBA1C in the last 72 hours. CBG: No results for input(s): GLUCAP in the last 168 hours. Lipid Profile: No results for input(s): CHOL, HDL, LDLCALC, TRIG, CHOLHDL, LDLDIRECT in the last 72 hours.  Thyroid Function Tests: No results for input(s): TSH, T4TOTAL, FREET4, T3FREE, THYROIDAB in the last 72 hours. Anemia Panel: No results for input(s): VITAMINB12, FOLATE, FERRITIN, TIBC, IRON, RETICCTPCT in the last 72 hours. Urine analysis:    Component Value Date/Time   COLORURINE YELLOW 06/04/2016 0201   APPEARANCEUR HAZY (A) 06/04/2016 0201   LABSPEC 1.015 06/04/2016 0201   PHURINE 5.0 06/04/2016 0201   GLUCOSEU NEGATIVE 06/04/2016 0201   HGBUR MODERATE (A) 06/04/2016 0201   BILIRUBINUR NEGATIVE 06/04/2016 0201   KETONESUR NEGATIVE 06/04/2016 0201   PROTEINUR NEGATIVE 06/04/2016 0201   UROBILINOGEN 1.0 02/04/2013 0014   NITRITE NEGATIVE 06/04/2016 0201   LEUKOCYTESUR MODERATE (A) 06/04/2016 0201   Recent Results (from the past 240 hour(s))  Blood Culture (routine x 2)     Status: None   Collection Time: 06/04/16  1:40 AM  Result Value Ref Range  Status   Specimen Description BLOOD RIGHT ARM  Final   Special Requests   Final    BOTTLES DRAWN AEROBIC AND ANAEROBIC Blood Culture adequate volume   Culture   Final    NO GROWTH 5 DAYS Performed at [facility], [address], [place], [place] [address]    Report Status 06/09/2016 FINAL  Final  Blood Culture (routine x 2)     Status: None   Collection Time: 06/04/16  1:55 AM  Result Value Ref Range Status   Specimen Description BLOOD LEFT HAND  Final   Special Requests   Final    BOTTLES DRAWN AEROBIC AND ANAEROBIC Blood Culture adequate volume   Culture   Final    NO GROWTH 5 DAYS Performed at [facility], [place] [address] [place], [place] [address]    Report Status 06/09/2016 FINAL  Final  Culture, Urine     Status: Abnormal   Collection Time: 06/04/16  2:01 AM  Result Value Ref Range Status   Specimen Description URINE, RANDOM  Final   Special Requests NONE  Final   Culture MULTIPLE SPECIES PRESENT, SUGGEST RECOLLECTION (A)  Final   Report Status 06/05/2016 FINAL  Final  MRSA PCR Screening     Status: None   Collection Time: 06/04/16  5:09 PM  Result Value Ref Range Status   MRSA by PCR NEGATIVE NEGATIVE Final    Comment:        The GeneXpert MRSA Assay (FDA approved for NASAL specimens only), is one component of a comprehensive MRSA colonization surveillance program. It is not intended to diagnose MRSA infection nor to guide or monitor treatment for MRSA infections.       Radiology Studies: No results found.  Scheduled Meds: . amitriptyline  50 mg Oral Daily  . docusate sodium  100 mg Oral BID  . enoxaparin (LOVENOX) injection  40 mg Subcutaneous Q24H  . furosemide  40 mg Intravenous BID  . gabapentin  300 mg Oral BID  . loratadine  10 mg Oral Daily  . nystatin  5 mL Oral QID  . potassium chloride  40 mEq Oral Daily  . sodium chloride flush  3 mL Intravenous Q12H   Continuous Infusions: . meropenem (MERREM) IV    . vancomycin Stopped  (06/09/16 1400)     LOS: 5 days   Time spent: 25 minutes.  Vance Gather, MD [facility] Pager [phone]  If 7PM-7AM, please contact night-coverage [email or web address] Password [email or web address] 06/09/2016, 3:03 PM

## 2016-06-10 DIAGNOSIS — Z5181 Encounter for therapeutic drug level monitoring: Secondary | ICD-10-CM

## 2016-06-10 DIAGNOSIS — B9689 Other specified bacterial agents as the cause of diseases classified elsewhere: Secondary | ICD-10-CM

## 2016-06-10 LAB — CBC
HCT: 31.1 % — ABNORMAL LOW (ref 36.0–46.0)
HEMOGLOBIN: 10 g/dL — AB (ref 12.0–15.0)
MCH: 27.9 pg (ref 26.0–34.0)
MCHC: 32.2 g/dL (ref 30.0–36.0)
MCV: 86.6 fL (ref 78.0–100.0)
Platelets: 339 10*3/uL (ref 150–400)
RBC: 3.59 MIL/uL — AB (ref 3.87–5.11)
RDW: 16.5 % — ABNORMAL HIGH (ref 11.5–15.5)
WBC: 21.6 10*3/uL — ABNORMAL HIGH (ref 4.0–10.5)

## 2016-06-10 LAB — HEMOGLOBIN A1C
HEMOGLOBIN A1C: 5.3 % (ref 4.8–5.6)
Mean Plasma Glucose: 105 mg/dL

## 2016-06-10 MED ORDER — MORPHINE SULFATE (PF) 4 MG/ML IV SOLN
4.0000 mg | Freq: Once | INTRAVENOUS | Status: AC | PRN
  Administered 2016-06-10: 4 mg via INTRAVENOUS
  Filled 2016-06-10: qty 1

## 2016-06-10 MED ORDER — MORPHINE SULFATE (PF) 4 MG/ML IV SOLN
4.0000 mg | INTRAVENOUS | Status: DC | PRN
  Administered 2016-06-10 – 2016-06-13 (×12): 4 mg via INTRAVENOUS
  Filled 2016-06-10 (×13): qty 1

## 2016-06-10 MED ORDER — SALINE SPRAY 0.65 % NA SOLN
1.0000 | NASAL | Status: DC | PRN
  Filled 2016-06-10: qty 44

## 2016-06-10 MED ORDER — FLUTICASONE PROPIONATE 50 MCG/ACT NA SUSP
2.0000 | Freq: Every day | NASAL | Status: DC
  Administered 2016-06-10 – 2016-06-13 (×4): 2 via NASAL
  Filled 2016-06-10: qty 16

## 2016-06-10 NOTE — Progress Notes (Signed)
    [facility]   Reason for visit: Follow up on cellulitis  Interval History: WBC up to 21, no fever, feels better overall.  No associated rash, diarrhea  Physical Exam: Constitutional:  Vitals:   06/09/16 2025 06/10/16 0504  BP: 128/71 139/82  Pulse: (!) 103 90  Resp: 20 20  Temp: 98.6 F (37 C) 98.5 F (36.9 C)   patient appears in NAD Respiratory: Normal respiratory effort; CTA B Cardiovascular: RRR GI: soft, nt, nd MS: left leg with less erythema, less warmth but has ice packs on it  Review of Systems: Constitutional: negative for chills and anorexia Gastrointestinal: negative for diarrhea  Lab Results  Component Value Date   WBC 21.6 (H) 06/10/2016   HGB 10.0 (L) 06/10/2016   HCT 31.1 (L) 06/10/2016   MCV 86.6 06/10/2016   PLT 339 06/10/2016    Lab Results  Component Value Date   CREATININE 0.64 06/09/2016   BUN 12 06/09/2016   NA 139 06/09/2016   K 3.5 06/09/2016   CL 99 (L) 06/09/2016   CO2 31 06/09/2016    Lab Results  Component Value Date   ALT 24 06/06/2016   AST 28 06/06/2016   ALKPHOS 57 06/06/2016     Microbiology: Recent Results (from the past 240 hour(s))  Blood Culture (routine x 2)     Status: None   Collection Time: 06/04/16  1:40 AM  Result Value Ref Range Status   Specimen Description BLOOD RIGHT ARM  Final   Special Requests   Final    BOTTLES DRAWN AEROBIC AND ANAEROBIC Blood Culture adequate volume   Culture   Final    NO GROWTH 5 DAYS Performed at [facility], [address] [place], [place] [address]    Report Status 06/09/2016 FINAL  Final  Blood Culture (routine x 2)     Status: None   Collection Time: 06/04/16  1:55 AM  Result Value Ref Range Status   Specimen Description BLOOD LEFT HAND  Final   Special Requests   Final    BOTTLES DRAWN AEROBIC AND ANAEROBIC Blood Culture adequate volume   Culture   Final    NO GROWTH 5 DAYS Performed at [facility], [address]  [place], [place] [address]    Report Status 06/09/2016 FINAL  Final  Culture, Urine     Status: Abnormal   Collection Time: 06/04/16  2:01 AM  Result Value Ref Range Status   Specimen Description URINE, RANDOM  Final   Special Requests NONE  Final   Culture MULTIPLE SPECIES PRESENT, SUGGEST RECOLLECTION (A)  Final   Report Status 06/05/2016 FINAL  Final  MRSA PCR Screening     Status: None   Collection Time: 06/04/16  5:09 PM  Result Value Ref Range Status   MRSA by PCR NEGATIVE NEGATIVE Final    Comment:        The GeneXpert MRSA Assay (FDA approved for NASAL specimens only), is one component of a comprehensive MRSA colonization surveillance program. It is not intended to diagnose MRSA infection nor to guide or monitor treatment for MRSA infections.     Impression/Plan:  1. Cellulitis - slow improvement. Continue Teflaro. If continues to improve, can convert to oral doxycycline and keflex at discharge. 2. Medication monitoring - creat has been stable.  Ordered for today again and will need to watch with previous vancomycin.

## 2016-06-10 NOTE — Progress Notes (Signed)
Inpatient Rehabilitation  Per PT request, patient was screened by Fae Pippin for appropriateness for an Inpatient Acute Rehab consult.  We are tight on bed availability over the next few days; however, if the team wishes to pursue CIR placement next week then an OT order in addition to Inpatient Rehab consult order would be recommended.  Please order if you are agreeable and call if questions.   Charlane Ferretti., CCC/SLP Admission Coordinator  [facility]  Cell [phone]

## 2016-06-10 NOTE — Progress Notes (Signed)
PROGRESS NOTE  Kirsten Avery  XFG:[id number] DOB: 12/17/1965 DOA: 06/03/2016 PCP: No primary care provider on file.   Brief Narrative: Kirsten Avery is a 51 y.o. female with a history of HTN and fibromyalgia who presented with 2-3 day history of fevers, chills, and left lower extremity pain. The patient states that she began having intermittent headaches and sore throat that began on 05/29/2016 with some shortness of breath and nonproductive cough. She states that her headaches and sore throat overall have improved, but continued to have a nonproductive cough and shortness of breath. She denied any chest pain, hemoptysis, nausea, vomiting, diarrhea, abdominal pain, dysuria, hematuria. She denied any recent injury or trauma to her left lower extremity.   Upon presentation, the patient was noted to have WBC 14.3 with lactic acid 1.61. There was evidence of left leg cellulitis. Venous duplex of the left lower extremity was negative for DVT. CT angiogram chest was negative for pulmonary embolus.Influenza was negative and UA was negative for pyuria. The patient was started on empiric vancomycin. As WBC increased, meropenem was added. MR of the LLE demonstrated changes consistent with early fasciitis and low-level myositis. CPK was elevated and she was given IV fluids. The patient ultimately defervesced and CPK normalized. The patient developed volume overload from fluid resuscitation with pulmonary edema which was treated with lasix with improvement.   Assessment & Plan: Active Problems:   Blind   Sepsis ([place])   Cellulitis, leg   Fever   Hypokalemia   Benign essential HTN   Fasciitis   Acute CHF ([place])  Sepsis: Secondary to left lower extremity cellulitis/fasciitis. Blood cultures from 4/13 no growth. Sepsis physiology resolved. - Continue ceftaroline per ID - Monitor leukocytosis with CBC daily. Refractory leukocytosis and clinical pain/erythema today.  Left lower extremity  cellulitis/Fasciitis/Myositis: 06/05/2016 MRI LLE--abnormal edema along muscle margins of the gastrocnemius and soleus with involvement along the superficial fascial planes and deep fascial planes between muscles;low level enhancement of fascial planes; no soft tissue gas Duplex U/S negative for DVT. HIV nonreactive. Slow resolution due to obesity. - CPK 451>>>293>>>148 on 4/17 - CRP 23.1 > 26.1, ESR 2 - Erythema, edema, pain, leukocytosis stable, yet to appreciably improve. - Change pain control fentanyl 50g to morphine 43m IV q2h prn with one time dose prior to PT. Also has tramadol.  - Apply ice, elevate extremity higher than previous, discussed with RN and pt. - HbA1c pending.  Acute respiratory failure with hypoxia: Due to pulmonary edema from volume resuscitation for sepsis. CXR also showed small pleural effusions R>L. Although CT abd and CXR suggest PNA, pt was clinically volume overloaded. CT angiogram chest--negative for PE; no pulmonary edema or infiltrates. Echocardiogram essentially normal.  - Started lasix 419mIV BID on 4/17, continue until creatinine bumps to minimize edema leading to poor healing. Recheck BMP in AM. - Monitor daily weights, strict I/O - Wean oxygen as able, 100% on room air, no crackles so suspect no true oxygen requirement.   Hypokalemia: Repleted and resolved. Mg wnl.  Essential hypertension: Discontinued lisinopril/HCTZ secondary to soft blood pressure and renal insufficiency initially. Normotensive while holding these still. - Continue hydralazine prn SBP >180  Fibromyalgia: Chronic, stable.  - Continue gabapentin and amitriptyline  Abdominal pain: CT abd/pelvis--multifocal patchy opacities with small bilateral pleural effusions, right greater than left; nonobstructive; no acute abnornmalities. Since improved, tolerating diet  DVT prophylaxis: Lovenox Code Status: Full Family Communication: Daughter at bedside Disposition Plan: Anticipate home when  improving. Continue IV abx,  lab monitoring. ID consulting. Therapy consult pending.  Consultants:   ID, Dr. Linus Salmons  Procedures:   None  Antimicrobials: Zosyn x 1 on 4/14 vancomycin 4/14>>> meropenem 4/15>>>  Subjective: Pain is stable, not controlled with fentanyl with any movement at all. She reports improvement in dyspnea, no chest pain. No fevers.   Objective: Vitals:   06/09/16 0508 06/09/16 1719 06/09/16 2025 06/10/16 0504  BP: 138/80 137/84 128/71 139/82  Pulse: 90 (!) 104 (!) 103 90  Resp: '20 20 20 20  ' Temp: 98.2 F (36.8 C) 99.4 F (37.4 C) 98.6 F (37 C) 98.5 F (36.9 C)  TempSrc: Oral Oral Oral Oral  SpO2: 100% 100% 99% 100%  Weight: 106.3 kg (234 lb 5.6 oz)     Height:        Intake/Output Summary (Last 24 hours) at 06/10/16 0945 Last data filed at 06/10/16 0200  Gross per 24 hour  Intake              250 ml  Output             2250 ml  Net            -2000 ml   Filed Weights   06/04/16 1703 06/08/16 0500 06/09/16 0508  Weight: 107.5 kg (236 lb 15.9 oz) 107.3 kg (236 lb 8.9 oz) 106.3 kg (234 lb 5.6 oz)    Examination: General exam: Obese 51 y.o. female in no distress   Respiratory system: Non-labored breathing 2L by [place]. Bibasilar crackles improved, faint this AM. Cardiovascular system: Regular rate and rhythm. No murmur, rub, or gallop. No JVD, and trace pedal edema. Gastrointestinal system: Abdomen soft, non-tender, non-distended, with normoactive bowel sounds. No organomegaly or masses felt. Central nervous system: Alert and oriented. No focal neurological deficits. Extremities: LLE with 1+ edema below knee, splotchy erythema extending irregularly circumferentially on left calf not involving knee or foot, tender to palpation with focal annular induration on posterior/medial calf. Psychiatry: Judgement and insight appear normal. Mood & affect appropriate.   Data Reviewed: I have personally reviewed following labs and imaging studies  CBC:  Recent  Labs Lab 06/04/16 0131  06/06/16 0352 06/07/16 0315 06/08/16 0413 06/09/16 0509 06/10/16 0455  WBC 14.3*  < > 22.5* 23.8* 18.1* 18.1* 21.6*  NEUTROABS 13.0*  --  20.1*  --   --   --   --   HGB 12.7  < > 9.6* 9.6* 9.5* 9.2* 10.0*  HCT 38.9  < > 29.4* 28.9* 28.4* 28.4* 31.1*  MCV 86.6  < > 85.5 83.8 84.0 83.3 86.6  PLT 254  < > 205 223 258 312 339  < > = values in this interval not displayed. Basic Metabolic Panel:  Recent Labs Lab 06/05/16 0358 06/06/16 0352 06/07/16 0315 06/08/16 0320 06/09/16 0509  NA 135 138 136 134* 139  K 3.0* 3.5 3.5 3.6 3.5  CL 107 110 105 99* 99*  CO2 21* 21* '24 25 31  ' GLUCOSE 126* 110* 105* 88 95  BUN '14 10 11 12 12  ' CREATININE 1.23* 0.90 0.77 0.71 0.64  CALCIUM 7.2* 7.4* 7.7* 7.9* 8.1*  MG  --   --   --  1.8  --    GFR: Estimated Creatinine Clearance: 96.4 mL/min (by C-G formula based on SCr of 0.64 mg/dL). Liver Function Tests:  Recent Labs Lab 06/04/16 0131 06/06/16 0352  AST 34 28  ALT 31 24  ALKPHOS 73 57  BILITOT 1.1 2.0*  PROT 8.3* 6.0*  ALBUMIN 3.9 2.4*   No results for input(s): LIPASE, AMYLASE in the last 168 hours. No results for input(s): AMMONIA in the last 168 hours. Coagulation Profile: No results for input(s): INR, PROTIME in the last 168 hours. Cardiac Enzymes:  Recent Labs Lab 06/05/16 1235 06/06/16 0352 06/07/16 0315  CKTOTAL 451* 293* 148   BNP (last 3 results) No results for input(s): PROBNP in the last 8760 hours. HbA1C: No results for input(s): HGBA1C in the last 72 hours. CBG: No results for input(s): GLUCAP in the last 168 hours. Lipid Profile: No results for input(s): CHOL, HDL, LDLCALC, TRIG, CHOLHDL, LDLDIRECT in the last 72 hours. Thyroid Function Tests: No results for input(s): TSH, T4TOTAL, FREET4, T3FREE, THYROIDAB in the last 72 hours. Anemia Panel: No results for input(s): VITAMINB12, FOLATE, FERRITIN, TIBC, IRON, RETICCTPCT in the last 72 hours. Urine analysis:    Component Value  Date/Time   COLORURINE YELLOW 06/04/2016 0201   APPEARANCEUR HAZY (A) 06/04/2016 0201   LABSPEC 1.015 06/04/2016 0201   PHURINE 5.0 06/04/2016 0201   GLUCOSEU NEGATIVE 06/04/2016 0201   HGBUR MODERATE (A) 06/04/2016 0201   BILIRUBINUR NEGATIVE 06/04/2016 0201   KETONESUR NEGATIVE 06/04/2016 0201   PROTEINUR NEGATIVE 06/04/2016 0201   UROBILINOGEN 1.0 02/04/2013 0014   NITRITE NEGATIVE 06/04/2016 0201   LEUKOCYTESUR MODERATE (A) 06/04/2016 0201   Recent Results (from the past 240 hour(s))  Blood Culture (routine x 2)     Status: None   Collection Time: 06/04/16  1:40 AM  Result Value Ref Range Status   Specimen Description BLOOD RIGHT ARM  Final   Special Requests   Final    BOTTLES DRAWN AEROBIC AND ANAEROBIC Blood Culture adequate volume   Culture   Final    NO GROWTH 5 DAYS Performed at [facility], [address], [place], [place] [address]    Report Status 06/09/2016 FINAL  Final  Blood Culture (routine x 2)     Status: None   Collection Time: 06/04/16  1:55 AM  Result Value Ref Range Status   Specimen Description BLOOD LEFT HAND  Final   Special Requests   Final    BOTTLES DRAWN AEROBIC AND ANAEROBIC Blood Culture adequate volume   Culture   Final    NO GROWTH 5 DAYS Performed at [facility], [place] [address], [place], [place] [address]    Report Status 06/09/2016 FINAL  Final  Culture, Urine     Status: Abnormal   Collection Time: 06/04/16  2:01 AM  Result Value Ref Range Status   Specimen Description URINE, RANDOM  Final   Special Requests NONE  Final   Culture MULTIPLE SPECIES PRESENT, SUGGEST RECOLLECTION (A)  Final   Report Status 06/05/2016 FINAL  Final  MRSA PCR Screening     Status: None   Collection Time: 06/04/16  5:09 PM  Result Value Ref Range Status   MRSA by PCR NEGATIVE NEGATIVE Final    Comment:        The GeneXpert MRSA Assay (FDA approved for NASAL specimens only), is one component of a comprehensive MRSA  colonization surveillance program. It is not intended to diagnose MRSA infection nor to guide or monitor treatment for MRSA infections.       Radiology Studies: No results found.  Scheduled Meds: . amitriptyline  50 mg Oral Daily  . docusate sodium  100 mg Oral BID  . enoxaparin (LOVENOX) injection  40 mg Subcutaneous Q24H  . fluticasone  2 spray  Each Nare Daily  . furosemide  40 mg Intravenous BID  . gabapentin  300 mg Oral BID  . loratadine  10 mg Oral Daily  . nystatin  5 mL Oral QID  . potassium chloride  40 mEq Oral Daily  . sodium chloride flush  3 mL Intravenous Q12H   Continuous Infusions: . ceFTAROline (TEFLARO) IV 600 mg (06/10/16 0825)     LOS: 6 days   Time spent: 25 minutes.  Vance Gather, MD [facility] Pager [phone]  If 7PM-7AM, please contact night-coverage [email or web address] Password [email or web address] 06/10/2016, 9:45 AM

## 2016-06-10 NOTE — Care Management Important Message (Signed)
Important Message  Patient Details  Name: Persia Lintner MRN: [id number] Date of Birth: [birth year]   Medicare Important Message Given:  Yes    Caren Macadam 06/10/2016, 11:44 AMImportant Message  Patient Details  Name: Cadee Agro MRN: [id number] Date of Birth: [birth year]   Medicare Important Message Given:  Yes    Caren Macadam 06/10/2016, 11:44 AM

## 2016-06-10 NOTE — Progress Notes (Signed)
Physical Therapy Treatment Patient Details Name: Kirsten Avery MRN: [id number] DOB: May 22, 1965 Today's Date: 06/10/2016    History of Present Illness 51 y.o. female with a history of HTN, fibromyalgia, blindness and admitted with sepsis, Left lower extremity cellulitis/Fasciitis/Myositis    PT Comments    Pt assisted OOB to recliner today.  Pt appears to be tolerating mobility better however reports only a little relief of pain since yesterday.  RN in to give pain meds  Follow Up Recommendations  CIR     Equipment Recommendations  Rolling walker with 5" wheels;Hospital bed    Recommendations for Other Services       Precautions / Restrictions Precautions Precautions: Fall Precaution Comments: pt is blind    Mobility  Bed Mobility Overal bed mobility: Needs Assistance Bed Mobility: Supine to Sit     Supine to sit: Min assist;HOB elevated     General bed mobility comments: pt utilized rail to self assist, assist for L LE  Transfers Overall transfer level: Needs assistance Equipment used: Rolling walker (2 wheeled) Transfers: Sit to/from [employer] Sit to Stand: Min assist;+2 safety/equipment Stand pivot transfers: Min assist;+2 safety/equipment       General transfer comment: pt able to tolerate transfer better today however performs TDWB due to pain, verbal cues for using UEs through RW to assist with pain control and pivoting R foot  Ambulation/Gait                 Stairs            Wheelchair Mobility    Modified Rankin (Stroke Patients Only)       Balance                                            Cognition Arousal/Alertness: Awake/alert Behavior During Therapy: WFL for tasks assessed/performed Overall Cognitive Status: Within Functional Limits for tasks assessed                                        Exercises      General Comments        Pertinent Vitals/Pain Pain  Assessment: Faces Faces Pain Scale: Hurts even more Pain Location: L lower leg Pain Descriptors / Indicators: Sore;Tightness;Grimacing;Crying Pain Intervention(s): Limited activity within patient's tolerance;Monitored during session;RN gave pain meds during session;Ice applied    Home Living                      Prior Function            PT Goals (current goals can now be found in the care plan section) Progress towards PT goals: Progressing toward goals    Frequency    Min 3X/week      PT Plan Current plan remains appropriate    Co-evaluation             End of Session Equipment Utilized During Treatment: Gait belt Activity Tolerance: Patient tolerated treatment well Patient left: in chair;with call bell/phone within reach Nurse Communication: Mobility status PT Visit Diagnosis: Difficulty in walking, not elsewhere classified (R26.2);Pain Pain - Right/Left: Left Pain - part of body: Leg     Time: 4098-1191 PT Time Calculation (min) (ACUTE ONLY): 18 min  Charges:  $Therapeutic Activity: 8-22 mins  G CodesZenovia Jarred, PT, DPT 06/10/2016 Pager: [phone]    Maida Sale E 06/10/2016, 1:25 PM

## 2016-06-11 DIAGNOSIS — D72829 Elevated white blood cell count, unspecified: Secondary | ICD-10-CM

## 2016-06-11 LAB — BASIC METABOLIC PANEL
Anion gap: 10 (ref 5–15)
BUN: 10 mg/dL (ref 6–20)
CALCIUM: 8.7 mg/dL — AB (ref 8.9–10.3)
CO2: 31 mmol/L (ref 22–32)
CREATININE: 0.72 mg/dL (ref 0.44–1.00)
Chloride: 95 mmol/L — ABNORMAL LOW (ref 101–111)
Glucose, Bld: 102 mg/dL — ABNORMAL HIGH (ref 65–99)
Potassium: 3.8 mmol/L (ref 3.5–5.1)
Sodium: 136 mmol/L (ref 135–145)

## 2016-06-11 LAB — CBC
HCT: 34.7 % — ABNORMAL LOW (ref 36.0–46.0)
HEMOGLOBIN: 11.1 g/dL — AB (ref 12.0–15.0)
MCH: 28 pg (ref 26.0–34.0)
MCHC: 32 g/dL (ref 30.0–36.0)
MCV: 87.6 fL (ref 78.0–100.0)
PLATELETS: 407 10*3/uL — AB (ref 150–400)
RBC: 3.96 MIL/uL (ref 3.87–5.11)
RDW: 16.7 % — AB (ref 11.5–15.5)
WBC: 24.4 10*3/uL — ABNORMAL HIGH (ref 4.0–10.5)

## 2016-06-11 MED ORDER — BISACODYL 10 MG RE SUPP: 10.0000 mg | Freq: Every day | RECTAL | Status: DC | PRN

## 2016-06-11 MED ORDER — POTASSIUM CHLORIDE 20 MEQ/15ML (10%) PO SOLN
40.0000 meq | Freq: Every day | ORAL | Status: DC
  Administered 2016-06-11: 40 meq via ORAL
  Filled 2016-06-11 (×2): qty 30

## 2016-06-11 MED ORDER — SENNOSIDES-DOCUSATE SODIUM 8.6-50 MG PO TABS: 1.0000 | ORAL_TABLET | Freq: Every evening | ORAL | Status: DC | PRN

## 2016-06-11 NOTE — Progress Notes (Signed)
PROGRESS NOTE  Kirsten Avery  YQI:[id number] DOB: 1965/07/29 DOA: 06/03/2016 PCP: No primary care provider on file.   Brief Narrative: Kirsten Avery is a 51 y.o. female with a history of HTN and fibromyalgia who presented with 2-3 day history of fevers, chills, and left lower extremity pain. The patient states that she began having intermittent headaches and sore throat that began on 05/29/2016 with some shortness of breath and nonproductive cough. She states that her headaches and sore throat overall have improved, but continued to have a nonproductive cough and shortness of breath. She denied any chest pain, hemoptysis, nausea, vomiting, diarrhea, abdominal pain, dysuria, hematuria. She denied any recent injury or trauma to her left lower extremity.   Upon presentation, the patient was noted to have WBC 14.3 with lactic acid 1.61. There was evidence of left leg cellulitis. Venous duplex of the left lower extremity was negative for DVT. CT angiogram chest was negative for pulmonary embolus.Influenza was negative and UA was negative for pyuria. The patient was started on empiric vancomycin. As WBC increased, meropenem was added. MR of the LLE demonstrated changes consistent with early fasciitis and low-level myositis. CPK was elevated and she was given IV fluids. The patient ultimately defervesced and CPK normalized. The patient developed volume overload from fluid resuscitation with pulmonary edema which was treated with lasix with improvement.   Assessment & Plan: Active Problems:   Blind   Sepsis ([place])   Cellulitis, leg   Fever   Hypokalemia   Benign essential HTN   Fasciitis   Acute CHF ([place])  Sepsis: Secondary to left lower extremity cellulitis/fasciitis. Blood cultures from 4/13 no growth. Sepsis physiology resolved. - Continue ceftaroline per ID - Monitor leukocytosis with CBC daily. Leukocytosis worsening.  Left lower extremity cellulitis/Fasciitis/Myositis: 06/05/2016 MRI  LLE--abnormal edema along muscle margins of the gastrocnemius and soleus with involvement along the superficial fascial planes and deep fascial planes between muscles;low level enhancement of fascial planes; no soft tissue gas Duplex U/S negative for DVT. HIV nonreactive. Slow resolution due to obesity. A1c normal. - CPK 451>>>293>>>148 on 4/17 - CRP 23.1 > 26.1, ESR 2 - Erythema better, leukocytosis worsening and pain still not allowing significant ambulation. - Morphine 64m IV q2h prn with one time dose prior to PT. Also has tramadol.  - Apply ice, elevate extremity higher than previous. This is helping.   Acute respiratory failure with hypoxia: Due to pulmonary edema from volume resuscitation for sepsis. CXR also showed small pleural effusions R>L. Although CT abd and CXR suggest PNA, pt was clinically volume overloaded. CT angiogram chest--negative for PE; no pulmonary edema or infiltrates. Echocardiogram essentially normal.  - Started lasix 475mIV BID on 4/17, continue until creatinine bumps to minimize edema leading to poor healing. Recheck BMP in AM. - Monitor daily weights, strict I/O - Wean oxygen as able, 100% on room air, no crackles so suspect no true oxygen requirement.   Hypokalemia: Repleted and resolved. Mg wnl.  Essential hypertension: Discontinued lisinopril/HCTZ secondary to soft blood pressure and renal insufficiency initially. Normotensive while holding these still. - Continue hydralazine prn SBP >180  Fibromyalgia: Chronic, stable.  - Continue gabapentin and amitriptyline  Abdominal pain: CT abd/pelvis--multifocal patchy opacities with small bilateral pleural effusions, right greater than left; nonobstructive; no acute abnornmalities. Since improved, tolerating diet  DVT prophylaxis: Lovenox Code Status: Full Family Communication: Daughter at bedside Disposition Plan: Therapy recommended CIR. Will get OT evaluation and consult CIR.  Consultants:   ID, Dr.  CoLinus Salmons  Procedures:   None  Antimicrobials: Zosyn x 1 on 4/14 vancomycin 4/14 - 4/19 meropenem 4/15 - 4/19 Teflaro 4/19 >>   Subjective: Pain is improved with ice packs and morphine. No fever, chills, dyspnea, chest pain.  Objective: Vitals:   06/10/16 1718 06/10/16 2124 06/11/16 0645 06/11/16 1552  BP:  122/75 135/86 118/67  Pulse:  (!) 103 98 (!) 105  Resp:  '20 20 20  ' Temp:  98.1 F (36.7 C) 98.1 F (36.7 C) 99.1 F (37.3 C)  TempSrc:  Oral Oral Oral  SpO2:  94% 94% 95%  Weight: 105.9 kg (233 lb 7.5 oz)     Height:        Intake/Output Summary (Last 24 hours) at 06/11/16 1640 Last data filed at 06/11/16 1601  Gross per 24 hour  Intake              250 ml  Output             3750 ml  Net            -3500 ml   Filed Weights   06/08/16 0500 06/09/16 0508 06/10/16 1718  Weight: 107.3 kg (236 lb 8.9 oz) 106.3 kg (234 lb 5.6 oz) 105.9 kg (233 lb 7.5 oz)    Examination: General exam: Obese, blind 51 y.o. female in no distress   Respiratory system: Non-labored breathing room air. Clear.  Cardiovascular system: Regular rate and rhythm. No murmur, rub, or gallop. No JVD, and trace pedal edema. Gastrointestinal system: Abdomen soft, non-tender, non-distended, with normoactive bowel sounds. No organomegaly or masses felt. Central nervous system: Alert and oriented. No focal neurological deficits. Extremities: LLE with 1+ edema below knee, splotchy erythema extending irregularly circumferentially on left calf not involving knee or foot, tender to palpation with focal annular induration on posterior/medial calf. Erythema less significant, tenderness improved.  Psychiatry: Judgement and insight appear normal. Mood & affect appropriate.   Data Reviewed: I have personally reviewed following labs and imaging studies  CBC:  Recent Labs Lab 06/06/16 0352 06/07/16 0315 06/08/16 0413 06/09/16 0509 06/10/16 0455 06/11/16 0459  WBC 22.5* 23.8* 18.1* 18.1* 21.6* 24.4*    NEUTROABS 20.1*  --   --   --   --   --   HGB 9.6* 9.6* 9.5* 9.2* 10.0* 11.1*  HCT 29.4* 28.9* 28.4* 28.4* 31.1* 34.7*  MCV 85.5 83.8 84.0 83.3 86.6 87.6  PLT 205 223 258 312 339 536*   Basic Metabolic Panel:  Recent Labs Lab 06/06/16 0352 06/07/16 0315 06/08/16 0320 06/09/16 0509 06/11/16 0459  NA 138 136 134* 139 136  K 3.5 3.5 3.6 3.5 3.8  CL 110 105 99* 99* 95*  CO2 21* '24 25 31 31  ' GLUCOSE 110* 105* 88 95 102*  BUN '10 11 12 12 10  ' CREATININE 0.90 0.77 0.71 0.64 0.72  CALCIUM 7.4* 7.7* 7.9* 8.1* 8.7*  MG  --   --  1.8  --   --    GFR: Estimated Creatinine Clearance: 96.2 mL/min (by C-G formula based on SCr of 0.72 mg/dL). Liver Function Tests:  Recent Labs Lab 06/06/16 0352  AST 28  ALT 24  ALKPHOS 57  BILITOT 2.0*  PROT 6.0*  ALBUMIN 2.4*   No results for input(s): LIPASE, AMYLASE in the last 168 hours. No results for input(s): AMMONIA in the last 168 hours. Coagulation Profile: No results for input(s): INR, PROTIME in the last 168 hours. Cardiac Enzymes:  Recent Labs Lab 06/05/16 1235 06/06/16  0352 06/07/16 0315  CKTOTAL 451* 293* 148   BNP (last 3 results) No results for input(s): PROBNP in the last 8760 hours. HbA1C:  Recent Labs  06/09/16 0509  HGBA1C 5.3   CBG: No results for input(s): GLUCAP in the last 168 hours. Lipid Profile: No results for input(s): CHOL, HDL, LDLCALC, TRIG, CHOLHDL, LDLDIRECT in the last 72 hours. Thyroid Function Tests: No results for input(s): TSH, T4TOTAL, FREET4, T3FREE, THYROIDAB in the last 72 hours. Anemia Panel: No results for input(s): VITAMINB12, FOLATE, FERRITIN, TIBC, IRON, RETICCTPCT in the last 72 hours. Urine analysis:    Component Value Date/Time   COLORURINE YELLOW 06/04/2016 0201   APPEARANCEUR HAZY (A) 06/04/2016 0201   LABSPEC 1.015 06/04/2016 0201   PHURINE 5.0 06/04/2016 0201   GLUCOSEU NEGATIVE 06/04/2016 0201   HGBUR MODERATE (A) 06/04/2016 0201   BILIRUBINUR NEGATIVE 06/04/2016  0201   KETONESUR NEGATIVE 06/04/2016 0201   PROTEINUR NEGATIVE 06/04/2016 0201   UROBILINOGEN 1.0 02/04/2013 0014   NITRITE NEGATIVE 06/04/2016 0201   LEUKOCYTESUR MODERATE (A) 06/04/2016 0201   Recent Results (from the past 240 hour(s))  Blood Culture (routine x 2)     Status: None   Collection Time: 06/04/16  1:40 AM  Result Value Ref Range Status   Specimen Description BLOOD RIGHT ARM  Final   Special Requests   Final    BOTTLES DRAWN AEROBIC AND ANAEROBIC Blood Culture adequate volume   Culture   Final    NO GROWTH 5 DAYS Performed at [facility], [address], [place], [place] [address]    Report Status 06/09/2016 FINAL  Final  Blood Culture (routine x 2)     Status: None   Collection Time: 06/04/16  1:55 AM  Result Value Ref Range Status   Specimen Description BLOOD LEFT HAND  Final   Special Requests   Final    BOTTLES DRAWN AEROBIC AND ANAEROBIC Blood Culture adequate volume   Culture   Final    NO GROWTH 5 DAYS Performed at [facility], [place] [address] [place], [place] [address]    Report Status 06/09/2016 FINAL  Final  Culture, Urine     Status: Abnormal   Collection Time: 06/04/16  2:01 AM  Result Value Ref Range Status   Specimen Description URINE, RANDOM  Final   Special Requests NONE  Final   Culture MULTIPLE SPECIES PRESENT, SUGGEST RECOLLECTION (A)  Final   Report Status 06/05/2016 FINAL  Final  MRSA PCR Screening     Status: None   Collection Time: 06/04/16  5:09 PM  Result Value Ref Range Status   MRSA by PCR NEGATIVE NEGATIVE Final    Comment:        The GeneXpert MRSA Assay (FDA approved for NASAL specimens only), is one component of a comprehensive MRSA colonization surveillance program. It is not intended to diagnose MRSA infection nor to guide or monitor treatment for MRSA infections.       Radiology Studies: No results found.  Scheduled Meds: . amitriptyline  50 mg Oral Daily  . docusate sodium  100 mg Oral BID  .  enoxaparin (LOVENOX) injection  40 mg Subcutaneous Q24H  . fluticasone  2 spray Each Nare Daily  . furosemide  40 mg Intravenous BID  . gabapentin  300 mg Oral BID  . loratadine  10 mg Oral Daily  . nystatin  5 mL Oral QID  . potassium chloride  40 mEq Oral Daily  . sodium  chloride flush  3 mL Intravenous Q12H   Continuous Infusions: . ceFTAROline (TEFLARO) IV Stopped (06/11/16 1103)     LOS: 7 days   Time spent: 25 minutes.  Vance Gather, MD [facility] Pager [phone]  If 7PM-7AM, please contact night-coverage [email or web address] Password [email or web address] 06/11/2016, 4:40 PM

## 2016-06-11 NOTE — Plan of Care (Signed)
Problem: Activity: Goal: Risk for activity intolerance will decrease Outcome: Progressing Started working with PT yesterday.  Very painful to bear weight on LLE.  Able to be assisted with walker and gait belt with 2 people to pivot from chair to bed.

## 2016-06-11 NOTE — Progress Notes (Signed)
    [facility]   Reason for visit: Follow up on cellulitis  Interval History: WBC up to 24, no fever, feels better overall.  No associated rash, nausea, diarrhea  Physical Exam: Constitutional:  Vitals:   06/10/16 2124 06/11/16 0645  BP: 122/75 135/86  Pulse: (!) 103 98  Resp: 20 20  Temp: 98.1 F (36.7 C) 98.1 F (36.7 C)   patient appears in NAD Respiratory: Normal respiratory effort; CTA B Cardiovascular: RRR GI: soft, nt, nd MS: left leg with less erythema, less warmth  Review of Systems: Constitutional: negative for chills and anorexia Gastrointestinal: negative for diarrhea  Lab Results  Component Value Date   WBC 24.4 (H) 06/11/2016   HGB 11.1 (L) 06/11/2016   HCT 34.7 (L) 06/11/2016   MCV 87.6 06/11/2016   PLT 407 (H) 06/11/2016    Lab Results  Component Value Date   CREATININE 0.72 06/11/2016   BUN 10 06/11/2016   NA 136 06/11/2016   K 3.8 06/11/2016   CL 95 (L) 06/11/2016   CO2 31 06/11/2016    Lab Results  Component Value Date   ALT 24 06/06/2016   AST 28 06/06/2016   ALKPHOS 57 06/06/2016     Microbiology: Recent Results (from the past 240 hour(s))  Blood Culture (routine x 2)     Status: None   Collection Time: 06/04/16  1:40 AM  Result Value Ref Range Status   Specimen Description BLOOD RIGHT ARM  Final   Special Requests   Final    BOTTLES DRAWN AEROBIC AND ANAEROBIC Blood Culture adequate volume   Culture   Final    NO GROWTH 5 DAYS Performed at [facility], [address], [place], [place] [address]    Report Status 06/09/2016 FINAL  Final  Blood Culture (routine x 2)     Status: None   Collection Time: 06/04/16  1:55 AM  Result Value Ref Range Status   Specimen Description BLOOD LEFT HAND  Final   Special Requests   Final    BOTTLES DRAWN AEROBIC AND ANAEROBIC Blood Culture adequate volume   Culture   Final    NO GROWTH 5 DAYS Performed at [facility], [address] [place],  [place] [address]    Report Status 06/09/2016 FINAL  Final  Culture, Urine     Status: Abnormal   Collection Time: 06/04/16  2:01 AM  Result Value Ref Range Status   Specimen Description URINE, RANDOM  Final   Special Requests NONE  Final   Culture MULTIPLE SPECIES PRESENT, SUGGEST RECOLLECTION (A)  Final   Report Status 06/05/2016 FINAL  Final  MRSA PCR Screening     Status: None   Collection Time: 06/04/16  5:09 PM  Result Value Ref Range Status   MRSA by PCR NEGATIVE NEGATIVE Final    Comment:        The GeneXpert MRSA Assay (FDA approved for NASAL specimens only), is one component of a comprehensive MRSA colonization surveillance program. It is not intended to diagnose MRSA infection nor to guide or monitor treatment for MRSA infections.     Impression/Plan:  1. Cellulitis - slow improvement. Continue Teflaro, improving, can convert to oral doxycycline and keflex at discharge or alternatively could use linezolid 600 mg oral twice a day if covered, either option for another 7 days at discharge. 2. Leukocytosis - from #1.  Improvement lagging behind clinical improvement.

## 2016-06-12 DIAGNOSIS — H547 Unspecified visual loss: Secondary | ICD-10-CM

## 2016-06-12 DIAGNOSIS — M60062 Infective myositis, left lower leg: Secondary | ICD-10-CM

## 2016-06-12 LAB — CBC
HCT: 28.9 % — ABNORMAL LOW (ref 36.0–46.0)
HEMOGLOBIN: 9.1 g/dL — AB (ref 12.0–15.0)
MCH: 27 pg (ref 26.0–34.0)
MCHC: 31.5 g/dL (ref 30.0–36.0)
MCV: 85.8 fL (ref 78.0–100.0)
Platelets: 432 10*3/uL — ABNORMAL HIGH (ref 150–400)
RBC: 3.37 MIL/uL — AB (ref 3.87–5.11)
RDW: 16.7 % — AB (ref 11.5–15.5)
WBC: 18.5 10*3/uL — AB (ref 4.0–10.5)

## 2016-06-12 LAB — C-REACTIVE PROTEIN: CRP: 14 mg/dL — ABNORMAL HIGH (ref <1.0)

## 2016-06-12 MED ORDER — POTASSIUM CHLORIDE CRYS ER 20 MEQ PO TBCR
40.0000 meq | EXTENDED_RELEASE_TABLET | Freq: Every day | ORAL | Status: DC
  Administered 2016-06-12 – 2016-06-13 (×2): 40 meq via ORAL
  Filled 2016-06-12 (×2): qty 2

## 2016-06-12 NOTE — NC FL2 (Signed)
[place] MEDICAID FL2 LEVEL OF CARE SCREENING TOOL     IDENTIFICATION  Patient Name: Kirsten Avery Birthdate: Jun 14, 1965 Sex: female Admission Date (Current Location): 06/03/2016  [facility] and [place] Number:  Producer, television/film/video and Address:  [facility],  [address] [place] [address]      Provider Number: [id number]  Attending Physician Name and Address:  Tyrone Nine, MD  Relative Name and Phone Number:       Current Level of Care: Hospital Recommended Level of Care: Skilled Nursing Facility Prior Approval Number:    Date Approved/Denied:   PASRR Number: [id number]  Discharge Plan: SNF    Current Diagnoses: Patient Active Problem List   Diagnosis Date Noted  . Acute CHF ([facility]) 06/07/2016  . Fasciitis 06/06/2016  . Hypokalemia 06/05/2016  . Benign essential HTN 06/05/2016  . Cellulitis, leg   . Fever   . Sepsis ([facility]) 06/04/2016  . Nexplanon insertion 09/08/2015  . Blind 09/03/2015  . Well woman exam 09/03/2015    Orientation RESPIRATION BLADDER Height & Weight     Self, Time, Situation, Place  Normal Incontinent, Indwelling catheter Weight: 233 lb 7.5 oz (105.9 kg) Height:   (157.5 cm)  BEHAVIORAL SYMPTOMS/MOOD NEUROLOGICAL BOWEL NUTRITION STATUS      Continent Diet (regular)  AMBULATORY STATUS COMMUNICATION OF NEEDS Skin   Extensive Assist Verbally Normal                       Personal Care Assistance Level of Assistance  Bathing, Dressing Bathing Assistance: Maximum assistance   Dressing Assistance: Maximum assistance     Functional Limitations Info  Sight Sight Info: Impaired (blind)        SPECIAL CARE FACTORS FREQUENCY  PT (By licensed PT), OT (By licensed OT)     PT Frequency: 5/wk OT Frequency: 5/wk            Contractures      Additional Factors Info  Code Status, Allergies Code Status Info: FULL Allergies Info: Percocet Oxycodone-acetaminophen, Vicodin Hydrocodone-acetaminophen            Current Medications (06/12/2016):  This is the current hospital active medication list Current Facility-Administered Medications  Medication Dose Route Frequency Provider Last Rate Last Dose  . acetaminophen (TYLENOL) tablet 650 mg  650 mg Oral Q6H PRN Clydie Braun, MD   650 mg at 06/09/16 1451  . alum & mag hydroxide-simeth (MAALOX/MYLANTA) 200-200-20 MG/5ML suspension 15 mL  15 mL Oral Q6H PRN Catarina Hartshorn, MD   15 mL at 06/08/16 0108  . amitriptyline (ELAVIL) tablet 50 mg  50 mg Oral Daily Belkys A Regalado, MD   50 mg at 06/11/16 2238  . bisacodyl (DULCOLAX) suppository 10 mg  10 mg Rectal Daily PRN Bobette Mo, MD      . ceftaroline [facility]) 600 mg in sodium chloride 0.9 % 250 mL IVPB  600 mg Intravenous Q12H Gardiner Barefoot, MD   Stopped at 06/12/16 1131  . docusate sodium (COLACE) capsule 100 mg  100 mg Oral BID Belkys A Regalado, MD   100 mg at 06/12/16 0823  . enoxaparin (LOVENOX) injection 40 mg  40 mg Subcutaneous Q24H Belkys A Regalado, MD   40 mg at 06/12/16 0546  . fluticasone (FLONASE) 50 MCG/ACT nasal spray 2 spray  2 spray Each Nare Daily Tyrone Nine, MD   2 spray at 06/11/16 1004  . furosemide (LASIX) injection 40 mg  40  mg Intravenous BID Catarina Hartshorn, MD   40 mg at 06/12/16 1610  . gabapentin (NEURONTIN) capsule 300 mg  300 mg Oral BID Belkys A Regalado, MD   300 mg at 06/11/16 2238  . guaiFENesin-dextromethorphan (ROBITUSSIN DM) 100-10 MG/5ML syrup 5 mL  5 mL Oral Q4H PRN Catarina Hartshorn, MD   5 mL at 06/11/16 0732  . iopamidol (ISOVUE-300) 61 % injection 15 mL  15 mL Oral BID PRN Catarina Hartshorn, MD      . ipratropium-albuterol (DUONEB) 0.5-2.5 (3) MG/3ML nebulizer solution 3 mL  3 mL Nebulization Q4H PRN Rondell A Katrinka Blazing, MD      . loratadine (CLARITIN) tablet 10 mg  10 mg Oral Daily Belkys A Regalado, MD   10 mg at 06/12/16 0823  . morphine 4 MG/ML injection 4 mg  4 mg Intravenous Q2H PRN Tyrone Nine, MD   4 mg at 06/12/16 0839  . nystatin (MYCOSTATIN) 100000 UNIT/ML  suspension 500,000 Units  5 mL Oral QID Leanne Chang, NP   500,000 Units at 06/12/16 9604  . ondansetron (ZOFRAN) tablet 4 mg  4 mg Oral Q6H PRN Belkys A Regalado, MD   4 mg at 06/10/16 0824   Or  . ondansetron (ZOFRAN) injection 4 mg  4 mg Intravenous Q6H PRN Belkys A Regalado, MD   4 mg at 06/08/16 2101  . potassium chloride SA (K-DUR,KLOR-CON) CR tablet 40 mEq  40 mEq Oral Daily Tyrone Nine, MD   40 mEq at 06/12/16 1031  . senna-docusate (Senokot-S) tablet 1 tablet  1 tablet Oral QHS PRN Bobette Mo, MD      . sodium chloride (OCEAN) 0.65 % nasal spray 1 spray  1 spray Each Nare PRN Tyrone Nine, MD      . sodium chloride flush (NS) 0.9 % injection 3 mL  3 mL Intravenous Q12H Belkys A Regalado, MD   3 mL at 06/11/16 1004  . traMADol (ULTRAM) tablet 50 mg  50 mg Oral Q6H PRN Catarina Hartshorn, MD   50 mg at 06/12/16 5409     Discharge Medications: Please see discharge summary for a list of discharge medications.  Relevant Imaging Results:  Relevant Lab Results:   Additional Information SS#: [id number]  Burna Sis, LCSW

## 2016-06-12 NOTE — Clinical Social Work Note (Signed)
Clinical Social Work Assessment  Patient Details  Name: Kirsten Avery MRN: [id number] Date of Birth: [birth year]  Date of referral:  06/12/16               Reason for consult:  Facility Placement                Permission sought to share information with:  Facility Medical sales representative, Family Supports Permission granted to share information::  Yes, Verbal Permission Granted  Name::     Human resources officer::  SNF  Relationship::  dtr  Contact Information:     Housing/Transportation Living arrangements for the past 2 months:  Apartment Source of Information:  Patient Patient Interpreter Needed:  None Criminal Activity/Legal Involvement Pertinent to Current Situation/Hospitalization:  No - Comment as needed Significant Relationships:  Adult Children Lives with:  Adult Children Do you feel safe going back to the place where you live?    Need for family participation in patient care:  Yes (Comment) (dtr helps some with caregiving at home)  Care giving concerns:  Pt lives at home with dtr- pt states they take care of each other.  Patient with new impairmentand does not think returning home would be an option at this time.   Social Worker assessment / plan:  CSW spoke with pt and pt dtr about PT recommendation for rehab stay prior to return home.  CSW explained CIR vs SNF- explained SNF referral process and capabilities.  Employment status:  Disabled (Comment on whether or not currently receiving Disability) Insurance information:  Medicare PT Recommendations:  Inpatient Rehab Consult Information / Referral to community resources:  Skilled Nursing Facility  Patient/Family's Response to care:  Pt very accepting of recommendations and is agreeable to a rehab stay prior to return home.  Hopeful for CIR but agreeable to SNF placement as well.  Pt and pt dtr would like option that would allow dtr to spend the night with patient.  Patient/Family's Understanding of and Emotional Response to  Diagnosis, Current Treatment, and Prognosis:  No questions or concerns at this time- pt resigned to do what is necessary to get better- hopeful that rehab placement will be short.  Emotional Assessment Appearance:  Appears stated age Attitude/Demeanor/Rapport:    Affect (typically observed):  Accepting, Appropriate, Pleasant Orientation:  Oriented to Situation, Oriented to  Time, Oriented to Place, Oriented to Self Alcohol / Substance use:  Not Applicable Psych involvement (Current and /or in the community):  No (Comment)  Discharge Needs  Concerns to be addressed:  Care Coordination Readmission within the last 30 days:  No Current discharge risk:  Physical Impairment Barriers to Discharge:  Continued Medical Work up   Burna Sis, LCSW 06/12/2016, 12:52 PM

## 2016-06-12 NOTE — Progress Notes (Signed)
PROGRESS NOTE  [name]  HUD:[id number] DOB: 17-Jan-1966 DOA: 06/03/2016 PCP: No primary care provider on file.   Brief Narrative: Kirsten Avery is a 51 y.o. female with a history of HTN and fibromyalgia who presented with 2-3 day history of fevers, chills, and left lower extremity pain. The patient states that she began having intermittent headaches and sore throat that began on 05/29/2016 with some shortness of breath and nonproductive cough. She states that her headaches and sore throat overall have improved, but continued to have a nonproductive cough and shortness of breath. She denied any chest pain, hemoptysis, nausea, vomiting, diarrhea, abdominal pain, dysuria, hematuria. She denied any recent injury or trauma to her left lower extremity.   Upon presentation, the patient was noted to have WBC 14.3 with lactic acid 1.61. There was evidence of left leg cellulitis. Venous duplex of the left lower extremity was negative for DVT. CT angiogram chest was negative for pulmonary embolus.Influenza was negative and UA was negative for pyuria. The patient was started on empiric vancomycin. As WBC increased, meropenem was added. MR of the LLE demonstrated changes consistent with early fasciitis and low-level myositis. CPK was elevated and she was given IV fluids. The patient ultimately defervesced and CPK normalized. The patient developed volume overload from fluid resuscitation with pulmonary edema which was treated with lasix with improvement.   Assessment & Plan: Active Problems:   Blind   Sepsis ([place])   Cellulitis, leg   Fever   Hypokalemia   Benign essential HTN   Fasciitis   Acute CHF ([place])  Sepsis: Secondary to left lower extremity cellulitis/fasciitis. Blood cultures from 4/13 no growth. Sepsis physiology resolved. - Continue ceftaroline per ID - Monitor leukocytosis with CBC daily. Leukocytosis worsening.  Left lower extremity cellulitis/Fasciitis/Myositis: 06/05/2016 MRI  LLE--abnormal edema along muscle margins of the gastrocnemius and soleus with involvement along the superficial fascial planes and deep fascial planes between muscles;low level enhancement of fascial planes; no soft tissue gas Duplex U/S negative for DVT. HIV nonreactive. Slow resolution due to obesity. A1c normal. - CPK 451>>>293>>>148 on 4/17 - CRP 23.1 > 26.1 > 14, ESR 2 - Pain still not allowing significant ambulation. Leukocytosis remains >18 so will rule out abscess with reimaging today.  - Morphine 70m IV q2h prn with one time dose prior to PT, has required 3 - 4 dose daily. Also has tramadol which was encouraged today.  - Apply ice, elevate extremity higher than previous.     Acute respiratory failure with hypoxia: Due to pulmonary edema from volume resuscitation for sepsis. CXR also showed small pleural effusions R>L. Although CT abd and CXR suggest PNA, pt was clinically volume overloaded. CT angiogram chest--negative for PE; no pulmonary edema or infiltrates. Echocardiogram essentially normal.  - Started lasix 499mIV BID on 4/17, continue until creatinine bumps to minimize edema leading to poor healing. Recheck BMP in AM. - Monitor daily weights, strict I/O - Resolved.  Hypokalemia: Repleted and resolved. Mg wnl.  Essential hypertension: Discontinued lisinopril/HCTZ secondary to soft blood pressure and renal insufficiency initially. Normotensive while holding these still. - Continue hydralazine prn SBP >180  Fibromyalgia: Chronic, stable.  - Continue gabapentin and amitriptyline  Abdominal pain: CT abd/pelvis--multifocal patchy opacities with small bilateral pleural effusions, right greater than left; nonobstructive; no acute abnornmalities. Since improved, tolerating diet  DVT prophylaxis: Lovenox Code Status: Full Family Communication: Daughters and son at bedside at bedside Disposition Plan: Therapy recommended CIR. Will get OT evaluation and consult CIR. Also  investigating SNF options.   Consultants:   ID, Dr. Linus Salmons  Procedures:   None  Antimicrobials: Zosyn x 1 on 4/14 vancomycin 4/14 - 4/19 meropenem 4/15 - 4/19 Teflaro 4/19 >>   Subjective: Pain still limiting any ambulation. Pain severe with weight bearing. No fevers. Not elevating leg.   Objective: Vitals:   06/11/16 0645 06/11/16 1552 06/11/16 2200 06/12/16 0547  BP: 135/86 118/67 124/67 135/77  Pulse: 98 (!) 105 (!) 103 (!) 101  Resp: 20 20 (!) 24 (!) 24  Temp: 98.1 F (36.7 C) 99.1 F (37.3 C) 98.8 F (37.1 C) 97.9 F (36.6 C)  TempSrc: Oral Oral Oral Oral  SpO2: 94% 95% 97% 100%  Weight:      Height:        Intake/Output Summary (Last 24 hours) at 06/12/16 1420 Last data filed at 06/12/16 0912  Gross per 24 hour  Intake              250 ml  Output             3350 ml  Net            -3100 ml   Filed Weights   06/08/16 0500 06/09/16 0508 06/10/16 1718  Weight: 107.3 kg (236 lb 8.9 oz) 106.3 kg (234 lb 5.6 oz) 105.9 kg (233 lb 7.5 oz)    Examination: General exam: Obese, blind 51 y.o. female in no distress   Respiratory system: Non-labored breathing room air. Clear.  Cardiovascular system: Regular rate and rhythm. No murmur, rub, or gallop. No JVD, and trace pedal edema. Gastrointestinal system: Abdomen soft, non-tender, non-distended, with normoactive bowel sounds. No organomegaly or masses felt. Central nervous system: Alert and oriented. Blind, otherwise no focal neurological deficits. Extremities: LLE with 1+ edema below knee, splotchy erythema extending irregularly circumferentially on left calf not involving knee or foot, tender to palpation with focal annular induration on posterior/medial calf. Erythema less bright than previously, tenderness improved.  Psychiatry: Judgement and insight appear normal. Mood & affect appropriate.   Data Reviewed: I have personally reviewed following labs and imaging studies  CBC:  Recent Labs Lab 06/06/16 0352   06/08/16 0413 06/09/16 0509 06/10/16 0455 06/11/16 0459 06/12/16 0557  WBC 22.5*  < > 18.1* 18.1* 21.6* 24.4* 18.5*  NEUTROABS 20.1*  --   --   --   --   --   --   HGB 9.6*  < > 9.5* 9.2* 10.0* 11.1* 9.1*  HCT 29.4*  < > 28.4* 28.4* 31.1* 34.7* 28.9*  MCV 85.5  < > 84.0 83.3 86.6 87.6 85.8  PLT 205  < > 258 312 339 407* 432*  < > = values in this interval not displayed. Basic Metabolic Panel:  Recent Labs Lab 06/06/16 0352 06/07/16 0315 06/08/16 0320 06/09/16 0509 06/11/16 0459  NA 138 136 134* 139 136  K 3.5 3.5 3.6 3.5 3.8  CL 110 105 99* 99* 95*  CO2 21* '24 25 31 31  ' GLUCOSE 110* 105* 88 95 102*  BUN '10 11 12 12 10  ' CREATININE 0.90 0.77 0.71 0.64 0.72  CALCIUM 7.4* 7.7* 7.9* 8.1* 8.7*  MG  --   --  1.8  --   --    GFR: Estimated Creatinine Clearance: 96.2 mL/min (by C-G formula based on SCr of 0.72 mg/dL). Liver Function Tests:  Recent Labs Lab 06/06/16 0352  AST 28  ALT 24  ALKPHOS 57  BILITOT 2.0*  PROT 6.0*  ALBUMIN 2.4*  No results for input(s): LIPASE, AMYLASE in the last 168 hours. No results for input(s): AMMONIA in the last 168 hours. Coagulation Profile: No results for input(s): INR, PROTIME in the last 168 hours. Cardiac Enzymes:  Recent Labs Lab 06/06/16 0352 06/07/16 0315  CKTOTAL 293* 148   BNP (last 3 results) No results for input(s): PROBNP in the last 8760 hours. HbA1C: No results for input(s): HGBA1C in the last 72 hours. CBG: No results for input(s): GLUCAP in the last 168 hours. Lipid Profile: No results for input(s): CHOL, HDL, LDLCALC, TRIG, CHOLHDL, LDLDIRECT in the last 72 hours. Thyroid Function Tests: No results for input(s): TSH, T4TOTAL, FREET4, T3FREE, THYROIDAB in the last 72 hours. Anemia Panel: No results for input(s): VITAMINB12, FOLATE, FERRITIN, TIBC, IRON, RETICCTPCT in the last 72 hours. Urine analysis:    Component Value Date/Time   COLORURINE YELLOW 06/04/2016 0201   APPEARANCEUR HAZY (A) 06/04/2016  0201   LABSPEC 1.015 06/04/2016 0201   PHURINE 5.0 06/04/2016 0201   GLUCOSEU NEGATIVE 06/04/2016 0201   HGBUR MODERATE (A) 06/04/2016 0201   BILIRUBINUR NEGATIVE 06/04/2016 0201   KETONESUR NEGATIVE 06/04/2016 0201   PROTEINUR NEGATIVE 06/04/2016 0201   UROBILINOGEN 1.0 02/04/2013 0014   NITRITE NEGATIVE 06/04/2016 0201   LEUKOCYTESUR MODERATE (A) 06/04/2016 0201   Recent Results (from the past 240 hour(s))  Blood Culture (routine x 2)     Status: None   Collection Time: 06/04/16  1:40 AM  Result Value Ref Range Status   Specimen Description BLOOD RIGHT ARM  Final   Special Requests   Final    BOTTLES DRAWN AEROBIC AND ANAEROBIC Blood Culture adequate volume   Culture   Final    NO GROWTH 5 DAYS Performed at [facility], [address], [place], [place] [address]    Report Status 06/09/2016 FINAL  Final  Blood Culture (routine x 2)     Status: None   Collection Time: 06/04/16  1:55 AM  Result Value Ref Range Status   Specimen Description BLOOD LEFT HAND  Final   Special Requests   Final    BOTTLES DRAWN AEROBIC AND ANAEROBIC Blood Culture adequate volume   Culture   Final    NO GROWTH 5 DAYS Performed at [facility], [place] [address] [place], [place] [address]    Report Status 06/09/2016 FINAL  Final  Culture, Urine     Status: Abnormal   Collection Time: 06/04/16  2:01 AM  Result Value Ref Range Status   Specimen Description URINE, RANDOM  Final   Special Requests NONE  Final   Culture MULTIPLE SPECIES PRESENT, SUGGEST RECOLLECTION (A)  Final   Report Status 06/05/2016 FINAL  Final  MRSA PCR Screening     Status: None   Collection Time: 06/04/16  5:09 PM  Result Value Ref Range Status   MRSA by PCR NEGATIVE NEGATIVE Final    Comment:        The GeneXpert MRSA Assay (FDA approved for NASAL specimens only), is one component of a comprehensive MRSA colonization surveillance program. It is not intended to diagnose MRSA infection nor to guide or monitor  treatment for MRSA infections.       Radiology Studies: No results found.  Scheduled Meds: . amitriptyline  50 mg Oral Daily  . docusate sodium  100 mg Oral BID  . enoxaparin (LOVENOX) injection  40 mg Subcutaneous Q24H  . fluticasone  2 spray Each Nare Daily  . furosemide  40  mg Intravenous BID  . gabapentin  300 mg Oral BID  . loratadine  10 mg Oral Daily  . nystatin  5 mL Oral QID  . potassium chloride  40 mEq Oral Daily  . sodium chloride flush  3 mL Intravenous Q12H   Continuous Infusions: . ceFTAROline (TEFLARO) IV Stopped (06/12/16 1131)     LOS: 8 days   Time spent: 25 minutes.  Vance Gather, MD [facility] Pager [phone]  If 7PM-7AM, please contact night-coverage [email or web address] Password [email or web address] 06/12/2016, 2:20 PM

## 2016-06-12 NOTE — Progress Notes (Signed)
Report given to Claretha Cooper, RN to assume care of patient.

## 2016-06-13 LAB — BASIC METABOLIC PANEL
Anion gap: 8 (ref 5–15)
BUN: 14 mg/dL (ref 6–20)
CO2: 27 mmol/L (ref 22–32)
CREATININE: 0.76 mg/dL (ref 0.44–1.00)
Calcium: 8.5 mg/dL — ABNORMAL LOW (ref 8.9–10.3)
Chloride: 100 mmol/L — ABNORMAL LOW (ref 101–111)
GFR calc Af Amer: >60 mL/min (ref >60)
GFR calc non Af Amer: >60 mL/min (ref >60)
Glucose, Bld: 133 mg/dL — ABNORMAL HIGH (ref 65–99)
Potassium: 3.8 mmol/L (ref 3.5–5.1)
SODIUM: 135 mmol/L (ref 135–145)

## 2016-06-13 LAB — CBC
HCT: 28.8 % — ABNORMAL LOW (ref 36.0–46.0)
Hemoglobin: 9.2 g/dL — ABNORMAL LOW (ref 12.0–15.0)
MCH: 28.1 pg (ref 26.0–34.0)
MCHC: 31.9 g/dL (ref 30.0–36.0)
MCV: 88.1 fL (ref 78.0–100.0)
PLATELETS: 453 10*3/uL — AB (ref 150–400)
RBC: 3.27 MIL/uL — ABNORMAL LOW (ref 3.87–5.11)
RDW: 16.8 % — AB (ref 11.5–15.5)
WBC: 12.6 10*3/uL — ABNORMAL HIGH (ref 4.0–10.5)

## 2016-06-13 MED ORDER — CEPHALEXIN 500 MG PO CAPS: 500.0000 mg | ORAL_CAPSULE | Freq: Four times a day (QID) | ORAL | 0 refills | Status: DC

## 2016-06-13 MED ORDER — DICLOFENAC SODIUM 75 MG PO TBEC: 75.0000 mg | DELAYED_RELEASE_TABLET | Freq: Two times a day (BID) | ORAL | Status: DC | PRN

## 2016-06-13 MED ORDER — TRAMADOL HCL 50 MG PO TABS: 100.0000 mg | ORAL_TABLET | Freq: Four times a day (QID) | ORAL | 0 refills | Status: DC | PRN

## 2016-06-13 MED ORDER — DOXYCYCLINE HYCLATE 100 MG PO CAPS: 100.0000 mg | ORAL_CAPSULE | Freq: Two times a day (BID) | ORAL | 0 refills | Status: DC

## 2016-06-13 NOTE — Progress Notes (Signed)
Pt prepared for discharged, foley removed as ordered. Pt given pain med for discomfort. tol well. Awaits EMS transport. SRP, RN

## 2016-06-13 NOTE — Clinical Social Work Placement (Signed)
Patient received and accepted bed offer at [facility]. Patient's RN can call report to [phone], patient going to room 604P. PTAR contacted, family aware. Packet complete.   CLINICAL SOCIAL WORK PLACEMENT  NOTE  Date:  06/13/2016  Patient Details  Name: Kirsten Avery MRN: [id number] Date of Birth: 17-Feb-1966  Clinical Social Work is seeking post-discharge placement for this patient at the Skilled  Nursing Facility level of care (*CSW will initial, date and re-position this form in  chart as items are completed):  Yes   Patient/family provided with [facility] Clinical Social Work Department's list of facilities offering this level of care within the geographic area requested by the patient (or if unable, by the patient's family).  Yes   Patient/family informed of their freedom to choose among providers that offer the needed level of care, that participate in Medicare, Medicaid or managed care program needed by the patient, have an available bed and are willing to accept the patient.  Yes   Patient/family informed of [facility]'s ownership interest in [facility], as well as of the fact that they are under no obligation to receive care at these facilities.  PASRR submitted to EDS on 06/12/16     PASRR number received on 06/12/16     Existing PASRR number confirmed on       FL2 transmitted to all facilities in geographic area requested by pt/family on 06/12/16     FL2 transmitted to all facilities within larger geographic area on       Patient informed that his/her managed care company has contracts with or will negotiate with certain facilities, including the following:        Yes   Patient/family informed of bed offers received.  Patient [facility] bed [facility]     Physician recommends and patient chooses bed at      Patient to be transferred to [facility] on 06/13/16.  Patient to be transferred to facility by PTAR     Patient family  notified on 06/13/16 of transfer.  Name of family member notified:  Daughter - [name]     PHYSICIAN       Additional Comment:    _______________________________________________ Antionette Poles, LCSW 06/13/2016, 3:23 PM

## 2016-06-13 NOTE — Progress Notes (Signed)
Physical Therapy Treatment Patient Details Name: Kirsten Avery MRN: [id number] DOB: 10/20/1965 Today's Date: 06/13/2016    History of Present Illness 51 y.o. female with a history of HTN, fibromyalgia, blindness and admitted with sepsis, Left lower extremity cellulitis/Fasciitis/Myositis    PT Comments    Pt slowly progressing with mobility, limited by 10/10 LLE pain. Min assist to pivot to recliner, unable to weight bear more than touch down on LLE 2* pain. Pain meds requested. HR 120 with activity.   Follow Up Recommendations  SNF     Equipment Recommendations  Rolling walker with 5" wheels;Hospital bed    Recommendations for Other Services       Precautions / Restrictions Precautions Precautions: Fall Precaution Comments: pt is blind Restrictions Weight Bearing Restrictions: No    Mobility  Bed Mobility Overal bed mobility: Needs Assistance Bed Mobility: Supine to Sit     Supine to sit: HOB elevated;Supervision     General bed mobility comments: pt utilized rail to self assist, verbal cues for technique, no physical assist  Transfers Overall transfer level: Needs assistance Equipment used: Rolling walker (2 wheeled) Transfers: Sit to/from [employer] Sit to Stand: Min assist;From elevated surface Stand pivot transfers: Min guard       General transfer comment: pt maintains LLE in touch down weight bearing 2* 10/10 pain, pt declined an attempt to ambulate 2* pain, verbal cues for technique and safety  Ambulation/Gait                 Stairs            Wheelchair Mobility    Modified Rankin (Stroke Patients Only)       Balance Overall balance assessment: Needs assistance Sitting-balance support: Feet supported Sitting balance-Leahy Scale: Fair     Standing balance support: Bilateral upper extremity supported Standing balance-Leahy Scale: Poor Standing balance comment: relies on BUE support 2* pain with WB LLE                            Cognition Arousal/Alertness: Awake/alert Behavior During Therapy: WFL for tasks assessed/performed Overall Cognitive Status: Within Functional Limits for tasks assessed                                        Exercises General Exercises - Upper Extremity Shoulder Flexion: AROM;Both;10 reps;Supine    General Comments        Pertinent Vitals/Pain Pain Score: 10-Worst pain ever Pain Location: L lower leg with activity, 6/10 at rest Pain Descriptors / Indicators: Sore;Tightness Pain Intervention(s): Limited activity within patient's tolerance;Monitored during session;Patient requesting pain meds-RN notified    Home Living                      Prior Function            PT Goals (current goals can now be found in the care plan section) Acute Rehab PT Goals PT Goal Formulation: With patient Time For Goal Achievement: 06/16/16 Potential to Achieve Goals: Good Progress towards PT goals: Progressing toward goals    Frequency    Min 3X/week      PT Plan Current plan remains appropriate    Co-evaluation             End of Session Equipment Utilized During Treatment: Gait belt Activity Tolerance: Patient  limited by pain Patient left: in chair;with call bell/phone within reach Nurse Communication: Mobility status PT Visit Diagnosis: Difficulty in walking, not elsewhere classified (R26.2);Pain Pain - Right/Left: Left Pain - part of body: Leg     Time: 1610-9604 PT Time Calculation (min) (ACUTE ONLY): 23 min  Charges:  $Therapeutic Activity: 8-22 mins                    G Codes:          Tamala Ser 06/13/2016, 10:35 AM [phone]

## 2016-06-13 NOTE — Progress Notes (Signed)
OT Cancellation Note  Patient Details Name: Kirsten Avery MRN: [id number] DOB: 07/13/1965   Cancelled Treatment:    Reason Eval/Treat Not Completed: Pain limiting ability to participate  Will check on pt later today or next day   Nicanor Alcon, [place] [phone] 06/13/2016, 1:18 PM

## 2016-06-13 NOTE — Discharge Summary (Addendum)
Physician Discharge Summary  Kashira Behunin ZOX:[id number] DOB: 12-11-1965 DOA: 06/03/2016  PCP: No primary care provider on file.  Admit date: 06/03/2016 Discharge date: 06/14/2016  Admitted From: Home Disposition: SNF   Recommendations for Outpatient Follow-up:  1. Follow up with PCP in 1-2 weeks 2. Please obtain CBC in one week  Home Health: N/A Equipment/Devices: N/A Discharge Condition: Stable CODE STATUS: Full Diet recommendation: Heart healthy  Brief/Interim Summary: Kirsten Avery is a 51 y.o. female with a history of HTN and fibromyalgia who presented with 2-3 day history of fevers, chills, and left lower extremity pain. The patient states that she began having intermittent headaches and sore throat that began on 05/29/2016 with some shortness of breath and nonproductive cough. She states that her headaches and sore throat overall have improved, but continued to have a nonproductive cough and shortness of breath. She denied any chest pain, hemoptysis, nausea, vomiting, diarrhea, abdominal pain, dysuria, hematuria. She denied any recent injury or trauma to her left lower extremity.   Upon presentation, the patient was noted to have WBC 14.3 with lactic acid 1.61. There was evidence of left leg cellulitis. Venous duplex of the left lower extremity was negative for DVT. CT angiogram chest was negative for pulmonary embolus.Influenza was negative and UA was negative for pyuria. The patient was started on empiric vancomycin. As WBC increased, meropenem was added. MR of the LLE demonstrated changes consistent with early fasciitis and low-level myositis. CPK was elevated and she was given IV fluids. The patient ultimately defervesced and CPK normalized. The patient developed volume overload from fluid resuscitation with pulmonary edema which was treated with lasix with improvement. Leukocytosis persisted with significant clinical evidence of cellulitis, so infectious disease was  consulted and ordered ceftaroline. The patient has improved and is stable for discharge. Pain related to her infection is severely limiting mobility and she will require daily physical therapy to regain functional mobility. She is discharged on doxycycline and keflex per ID recommendations.   Discharge Diagnoses:  Active Problems:   Blind   Sepsis (HCC)   Cellulitis, leg   Fever   Hypokalemia   Benign essential HTN   Fasciitis   Acute pulmonary edema (HCC)  Sepsis: Secondary to left lower extremity cellulitis/fasciitis. Blood cultures from 4/13 no growth. Sepsis physiology resolved. - Ceftaroline > doxycycline + keflex per ID recommendations. Will continue for 7 more days and this will need to be monitoring clinically and extended as needed. - Monitor leukocytosis with CBC in the next week.   Left lower extremity cellulitis/fasciitis/myositis: 06/05/2016 MRI LLE--abnormal edema along muscle margins of the gastrocnemius and soleus with involvement along the superficial fascial planes and deep fascial planes between muscles;low level enhancement of fascial planes; no soft tissue gas Duplex U/S negative for DVT. HIV nonreactive. Slow resolution due to obesity. A1c normal. - CPK 451>>>293>>>148 on 4/17 - CRP 23.1 > 26.1 > 14  - WBC trended downward.  - Pain still not allowing significant ambulation. Consideration was given to repeat imaging, though with steady improvement this is deferred. - Pt has allergy to hydrocodone and oxycodone. Morphine  IV q2h prn was given while inpatient. Will discharge on tramadol, will need ongoing pain management at SNF.   - Apply ice, elevate extremity above the level of the heart to assist with healing.  Acute respiratory failure with hypoxia: Resolved. Due to pulmonary edema from volume resuscitation for sepsis. CXR also showed small pleural effusions R>L. Although CT abd and CXR suggest PNA, pt was  clinically volume overloaded. CT angiogram  chest--negative for PE; no pulmonary edema or infiltrates. Echocardiogram essentially normal.  - Resolved.  Hypokalemia: Repleted and resolved. Mg wnl.  Essential hypertension: Discontinued lisinopril/HCTZ secondary to soft blood pressure and renal insufficiency initially. - Continue home medications at discharge  Fibromyalgia: Chronic, stable.  - Continue gabapentin and amitriptyline  Abdominal pain: CT abd/pelvis--multifocal patchy opacities with small bilateral pleural effusions, right greater than left; nonobstructive; no acute abnornmalities. Since improved, tolerating diet  Discharge Instructions  Allergies as of 06/13/2016      Reactions   Percocet [oxycodone-acetaminophen] Nausea Only   Tolerated with food   Vicodin [hydrocodone-acetaminophen] Nausea Only   Tolerated with food      Medication List    TAKE these medications   amitriptyline 50 MG tablet Commonly known as:  ELAVIL Take 50 mg by mouth daily.   cephALEXin 500 MG capsule Commonly known as:  KEFLEX Take 1 capsule (500 mg total) by mouth 4 (four) times daily.   cetirizine 10 MG tablet Commonly known as:  ZYRTEC Take 10 mg by mouth daily.   diclofenac 75 MG EC tablet Commonly known as:  VOLTAREN Take 1 tablet (75 mg total) by mouth 2 (two) times daily as needed for mild pain or moderate pain. What changed:  when to take this  reasons to take this   doxycycline 100 MG capsule Commonly known as:  VIBRAMYCIN Take 1 capsule (100 mg total) by mouth 2 (two) times daily.   gabapentin 300 MG capsule Commonly known as:  NEURONTIN Take 300 mg by mouth 2 (two) times daily.   ibuprofen 600 MG tablet Commonly known as:  ADVIL,MOTRIN TAKE 1 TABLET BY MOUTH EVERY 6 HOURS AS NEEDED   lisinopril-hydrochlorothiazide 20-12.5 MG tablet Commonly known as:  PRINZIDE,ZESTORETIC Take 1 tablet by mouth at bedtime.   traMADol 50 MG tablet Commonly known as:  ULTRAM Take 2 tablets (100 mg total) by mouth every  6 (six) hours as needed for moderate pain.      Contact information for after-discharge care    Destination    [facility] Follow up.   Specialty:  Skilled Nursing Facility Contact information: [address] [place] [address] [phone]             Allergies  Allergen Reactions  . Percocet [Oxycodone-Acetaminophen] Nausea Only    Tolerated with food  . Vicodin [Hydrocodone-Acetaminophen] Nausea Only    Tolerated with food    Consultations:  Infectious disease, Dr. Luciana Axe  Procedures/Studies: Dg Chest 2 View  Result Date: 06/04/2016 CLINICAL DATA:  Fever EXAM: CHEST  2 VIEW COMPARISON:  chest radiograph 02/04/2013. FINDINGS: The heart size and mediastinal contours are within normal limits. Both lungs are clear. The visualized skeletal structures are unremarkable. IMPRESSION: No active cardiopulmonary disease. Electronically Signed   By: Deatra Robinson M.D.   On: 06/04/2016 02:50   Ct Angio Chest Pe W Or Wo Contrast  Result Date: 06/04/2016 CLINICAL DATA:  Positive D-dimer, recent trauma 2 left leg, constant left leg and calf pain starting yesterday EXAM: CT ANGIOGRAPHY CHEST WITH CONTRAST TECHNIQUE: Multidetector CT imaging of the chest was performed using the standard protocol during bolus administration of intravenous contrast. Multiplanar CT image reconstructions and MIPs were obtained to evaluate the vascular anatomy. CONTRAST:  100 cc Isovue COMPARISON:  None. FINDINGS: Cardiovascular: Cardiac size is within normal limits. No pericardial effusion. No aortic aneurysm. No pulmonary embolus is noted. There are artifacts from patient's large body habitus. Mediastinum/Nodes:  No mediastinal hematoma or adenopathy. No hilar adenopathy is noted. Central airways are patent. Small hiatal hernia. Lungs/Pleura: Images of the lung parenchyma shows no infiltrate or pulmonary edema. Mild atelectasis noted bilateral lower lobe posteriorly. Upper Abdomen: The  visualized upper abdomen is unremarkable. Musculoskeletal: No destructive bony lesions are noted. Sagittal images of the spine shows mild degenerative changes mid and lower thoracic spine. Sagittal view of the sternum is unremarkable. Review of the MIP images confirms the above findings. IMPRESSION: 1. No definite pulmonary embolus is noted. There are artifacts from patient's large body habitus. 2. No mediastinal hematoma or adenopathy.  Small hiatal hernia. 3. No acute infiltrate or pulmonary edema. Dependent atelectasis noted bilateral lower lobe posteriorly. 4. Degenerative changes mid to lower thoracic spine. Electronically Signed   By: Natasha Mead M.D.   On: 06/04/2016 13:17   Ct Abdomen Pelvis W Contrast  Result Date: 06/06/2016 CLINICAL DATA:  Intermittent right lower quadrant pain, constipation x1 week EXAM: CT ABDOMEN AND PELVIS WITH CONTRAST TECHNIQUE: Multidetector CT imaging of the abdomen and pelvis was performed using the standard protocol following bolus administration of intravenous contrast. CONTRAST:  ISOVUE-300 IOPAMIDOL (ISOVUE-300) INJECTION 61% COMPARISON:  Chest radiographs dated 06/06/2016. CTA chest dated 06/04/2016. FINDINGS: Lower chest: Multifocal patchy opacities, right lower lobe predominant, suspicious for pneumonia. Small bilateral pleural effusions, right greater than left. Hepatobiliary: Liver is within normal limits. Gallbladder is unremarkable. No intrahepatic or extrahepatic ductal dilatation. Pancreas: Within normal limits. Spleen: Within normal limits. Adrenals/Urinary Tract: Adrenal glands are within normal limits. Kidneys are within normal limits.  No hydronephrosis. Bladder is within normal limits Stomach/Bowel: Stomach is within normal limits. No evidence of bowel obstruction. Normal appendix (series 2/ image 66). Vascular/Lymphatic: No evidence of abdominal aortic aneurysm. No suspicious abdominopelvic lymphadenopathy. Reproductive: Uterus is within normal limits.  Bilateral ovaries are within normal limits. Other: No abdominopelvic ascites. Musculoskeletal: Mild degenerative changes of the lower thoracic spine. IMPRESSION: No evidence of bowel obstruction.  Normal appendix. No CT findings to account for the patient's right lower quadrant abdominal pain. Multifocal patchy opacities, right lower lobe predominant, suspicious for pneumonia. Small bilateral pleural effusions, right greater than left. Electronically Signed   By: Charline Bills M.D.   On: 06/06/2016 14:27   Mr Tibia Fibula Left W Wo Contrast  Result Date: 06/05/2016 CLINICAL DATA:  Left leg and calf pain onset yesterday. Fibromyalgia. EXAM: MRI OF LOWER LEFT EXTREMITY WITHOUT AND WITH CONTRAST TECHNIQUE: Multiplanar, multisequence MR imaging of the left calf was performed both before and after administration of intravenous contrast. CONTRAST:  20mL MULTIHANCE GADOBENATE DIMEGLUMINE 529 MG/ML IV SOLN COMPARISON:  06/04/2016 knee radiographs FINDINGS: Bones/Joint/Cartilage Small to moderate left knee effusion. There is only mild associated synovial enhancement, and accordingly I am skeptical of septic joint. Ligaments Today's imaging was performed with very large field of view and is not suitable for assessing small ligamentous structures. Muscles, Tendons, and soft tissues Abnormal edema tracking along the gastrocnemius muscle margins (especially medially) and soleus muscle from the knee down to the distal tibial level, with involvement along both the superficial fascia planes and also along deep fascia planes between the muscles, with low-level associated enhancement along the fascia planes. No gas is tracking in the soft tissues. There is potentially some very subtle accentuated edema signal in the soleus and medial head gastrocnemius muscles, in a faint patchy distribution. Subcutaneous edema also tracks along the superficial fascia margin medially, anteriorly, and posteriorly in the calf. Anterior  compartment does not appear  involved. There is some expansion of the subcutaneous space on the left medial calf compared to the right. There appears to be some edema in the left distal thigh tracking around the iliotibial tract and biceps femoris, image 1/6. IMPRESSION: 1. Abnormal edema tracking along and to a lesser extent within the medial head gastrocnemius, soleus, and lateral head gastrocnemius in the calf, with low-level associated enhancement. No definite gas in the soft tissues. However, this may reflect early fasciitis with low-level myositis. There is also some edema tracking along the distal iliotibial tract and distal biceps femoris musculotendinous junction in the knee. No anterior compartmental involvement. 2. Small knee effusion. I am skeptical of septic joint given the minimal degree of synovial enhancement. 3. Subcutaneous edema and low-level enhancement especially medially in the calf potentially from cellulitis. Electronically Signed   By: Gaylyn Rong M.D.   On: 06/05/2016 11:25   Dg Chest Port 1 View  Result Date: 06/06/2016 CLINICAL DATA:  51 year old female with dyspnea. Hypertension. Sepsis. Subsequent encounter. EXAM: PORTABLE CHEST 1 VIEW COMPARISON:  06/04/2016. FINDINGS: Marked change from prior examination with prominent asymmetric airspace disease greater on the right. This may represent asymmetric pulmonary edema although infectious infiltrate cannot be excluded in proper clinical setting. No pneumothorax. Cardiomegaly. Gas distended bowel. IMPRESSION: Marked change from prior examination with prominent asymmetric airspace disease greater on the right. This may represent asymmetric pulmonary edema although infectious infiltrate cannot be excluded in proper clinical setting. Cardiomegaly. These results will be called to the ordering clinician or representative by the Radiologist Assistant, and communication documented in the PACS or zVision Dashboard. Electronically Signed    By: Lacy Duverney M.D.   On: 06/06/2016 11:36   Dg Knee Complete 4 Views Left  Result Date: 06/04/2016 CLINICAL DATA:  Knee pain EXAM: LEFT KNEE - COMPLETE 4+ VIEW COMPARISON:  None. FINDINGS: No evidence of fracture, dislocation, or joint effusion. No evidence of arthropathy or other focal bone abnormality. Soft tissues are unremarkable. IMPRESSION: No fracture, dislocation or arthropathy of the left knee. Electronically Signed   By: Deatra Robinson M.D.   On: 06/04/2016 02:51   Subjective: Pt with controlled pain unless attempting ambulation. Then morphine is still not sufficient for independent mobility. Last evaluation required 2x assist, today only 1x.  Discharge Exam: Vitals:   06/13/16 0432 06/13/16 1403  BP: 118/69 133/79  Pulse: 89 (!) 105  Resp: 18 18  Temp: 98.2 F (36.8 C) 98.8 F (37.1 C)   General: Pt is alert, awake, not in acute distress Cardiovascular: RRR, S1/S2 +, no rubs, no gallops Respiratory: CTA bilaterally, no wheezing, no rhonchi Abdominal: Soft, NT, ND, bowel sounds + Extremities: LLE with 1+ edema below knee, splotchy erythema extending irregularly circumferentially on left calf not involving knee or foot, tender to palpation with focal annular induration on posterior/medial calf. Erythema less bright than previously and receding from rough demarcations, tenderness improved.   Labs: BNP (last 3 results)  Recent Labs  06/06/16 1208  BNP 305.9*   Basic Metabolic Panel:  Recent Labs Lab 06/08/16 0320 06/09/16 0509 06/11/16 0459 06/13/16 0511  NA 134* 139 136 135  K 3.6 3.5 3.8 3.8  CL 99* 99* 95* 100*  CO2 GLUCOSE 88 95 102* 133*  BUN CREATININE 0.71 0.64 0.72 0.76  CALCIUM 7.9* 8.1* 8.7* 8.5*  MG 1.8  --   --   --    CBC:  Recent Labs Lab 06/09/16 0509  06/10/16 0455 06/11/16 0459 06/12/16 0557 06/13/16 0511  WBC 18.1* 21.6* 24.4* 18.5* 12.6*  HGB 9.2* 10.0* 11.1* 9.1* 9.2*  HCT 28.4* 31.1* 34.7* 28.9*  28.8*  MCV 83.3 86.6 87.6 85.8 88.1  PLT 312 339 407* 432* 453*   Cardiac Enzymes: No results for input(s): CKTOTAL, CKMB, CKMBINDEX, TROPONINI in the last 168 hours. Urinalysis    Component Value Date/Time   COLORURINE YELLOW 06/04/2016 0201   APPEARANCEUR HAZY (A) 06/04/2016 0201   LABSPEC 1.015 06/04/2016 0201   PHURINE 5.0 06/04/2016 0201   GLUCOSEU NEGATIVE 06/04/2016 0201   HGBUR MODERATE (A) 06/04/2016 0201   BILIRUBINUR NEGATIVE 06/04/2016 0201   KETONESUR NEGATIVE 06/04/2016 0201   PROTEINUR NEGATIVE 06/04/2016 0201   UROBILINOGEN 1.0 02/04/2013 0014   NITRITE NEGATIVE 06/04/2016 0201   LEUKOCYTESUR MODERATE (A) 06/04/2016 0201    Microbiology Recent Results (from the past 240 hour(s))  MRSA PCR Screening     Status: None   Collection Time: 06/04/16  5:09 PM  Result Value Ref Range Status   MRSA by PCR NEGATIVE NEGATIVE Final    Comment:        The GeneXpert MRSA Assay (FDA approved for NASAL specimens only), is one component of a comprehensive MRSA colonization surveillance program. It is not intended to diagnose MRSA infection nor to guide or monitor treatment for MRSA infections.     Time coordinating discharge: Approximately 40 minutes  Hazeline Junker, MD  [facility] 06/14/2016, 2:45 PM Pager [phone]

## 2016-06-13 NOTE — Progress Notes (Signed)
Pt discharged to [facility] report given to [place], Charity fundraiser at [facility]. EMS at bedside pt in stable condition. SRP RN

## 2016-06-14 ENCOUNTER — Encounter: Payer: Self-pay | Admitting: Internal Medicine

## 2016-06-14 ENCOUNTER — Other Ambulatory Visit: Payer: Self-pay | Admitting: Internal Medicine

## 2016-06-14 ENCOUNTER — Non-Acute Institutional Stay (SKILLED_NURSING_FACILITY): Payer: Medicare HMO | Admitting: Internal Medicine

## 2016-06-14 DIAGNOSIS — L03116 Cellulitis of left lower limb: Secondary | ICD-10-CM | POA: Diagnosis not present

## 2016-06-14 DIAGNOSIS — M797 Fibromyalgia: Secondary | ICD-10-CM

## 2016-06-14 DIAGNOSIS — D638 Anemia in other chronic diseases classified elsewhere: Secondary | ICD-10-CM | POA: Diagnosis not present

## 2016-06-14 DIAGNOSIS — I10 Essential (primary) hypertension: Secondary | ICD-10-CM | POA: Diagnosis not present

## 2016-06-14 DIAGNOSIS — R531 Weakness: Secondary | ICD-10-CM

## 2016-06-14 DIAGNOSIS — D72825 Bandemia: Secondary | ICD-10-CM | POA: Diagnosis not present

## 2016-06-14 DIAGNOSIS — E2839 Other primary ovarian failure: Secondary | ICD-10-CM

## 2016-06-14 NOTE — Progress Notes (Signed)
LOCATION: [facility]  PCP: No primary care provider on file.   Code Status: Full Code  Goals of care: Advanced Directive information Advanced Directives 06/03/2016  Does Patient Have a Medical Advance Directive? No  Would patient like information on creating a medical advance directive? No - Patient declined       Extended Emergency Contact Information Primary Emergency Contact: Luellen Pucker of [place] Home Phone: [phone] Relation: Daughter Secondary Emergency Contact: Sharen Hones [place] of [place] Home Phone: [phone] Relation: Son   Allergies  Allergen Reactions  . Percocet [Oxycodone-Acetaminophen] Nausea Only    Tolerated with food  . Vicodin [Hydrocodone-Acetaminophen] Nausea Only    Tolerated with food    Chief Complaint  Patient presents with  . New Admit To SNF    New Admision Visit      HPI:  Patient is a 51 y.o. female seen today for short term rehabilitation post hospital admission from 06/03/2016-06/13/2016 with fever and chills and pain to left lower extremity. Sepsis protocol was initiated and patient was placed on IV antibiotic and IV fluids. DVT was ruled out. She was being treated for left lower extremity cellulitis and became hypoxic with acute respiratory failure. CT angiogram of the chest was negative for pulmonary embolism. She developed pulmonary edema from IV fluids and required IV diuresis. Her antibiotics which H2 oral antibiotic on discharge per infectious disease recommendation. She has medical history of hypertension, fibromyalgia among others. She is seen in her room today.  Review of Systems:  Constitutional: Negative for fever, chills, diaphoresis. Energy level is slowly coming back. HENT: Negative for headache, congestion, nasal discharge, sore throat, difficulty swallowing.   Eyes: Negative for eye pain. She is legally blind.  Respiratory: Negative for cough, shortness of breath and wheezing.     Cardiovascular: Negative for chest pain, palpitations, leg swelling.  Gastrointestinal: Negative for heartburn, nausea, vomiting, abdominal pain, loss of appetite, melena, diarrhea and constipation. Last bowel movement was yesterday.   Genitourinary: Negative for dysuria and flank pain.  Musculoskeletal: Negative for back pain, fall in the facility. Positive for pain to left leg.  Neurological: Negative for dizziness. Psychiatric/Behavioral: Negative for depression   Past Medical History:  Diagnosis Date  . Blind   . Fibromyalgia   . Hypertension    No past surgical history on file. Social History:   reports that she has never smoked. She has never used smokeless tobacco. She reports that she does not drink alcohol or use drugs.  No family history on file.  Medications: Allergies as of 06/14/2016      Reactions   Percocet [oxycodone-acetaminophen] Nausea Only   Tolerated with food   Vicodin [hydrocodone-acetaminophen] Nausea Only   Tolerated with food      Medication List       Accurate as of 06/14/16  2:36 PM. Always use your most recent med list.          amitriptyline 50 MG tablet Commonly known as:  ELAVIL Take 50 mg by mouth daily.   cephALEXin 500 MG capsule Commonly known as:  KEFLEX Take 1 capsule (500 mg total) by mouth 4 (four) times daily.   cetirizine 10 MG tablet Commonly known as:  ZYRTEC Take 10 mg by mouth daily.   diclofenac 75 MG EC tablet Commonly known as:  VOLTAREN Take 1 tablet (75 mg total) by mouth 2 (two) times daily as needed for mild pain or moderate pain.   doxycycline 100 MG capsule Commonly  known as:  VIBRAMYCIN Take 1 capsule (100 mg total) by mouth 2 (two) times daily.   gabapentin 300 MG capsule Commonly known as:  NEURONTIN Take 300 mg by mouth 2 (two) times daily.   ibuprofen 600 MG tablet Commonly known as:  ADVIL,MOTRIN TAKE 1 TABLET BY MOUTH EVERY 6 HOURS AS NEEDED   lisinopril-hydrochlorothiazide 20-12.5 MG  tablet Commonly known as:  PRINZIDE,ZESTORETIC Take 1 tablet by mouth at bedtime.   traMADol 50 MG tablet Commonly known as:  ULTRAM Take 2 tablets (100 mg total) by mouth every 6 (six) hours as needed for moderate pain.       Immunizations:  There is no immunization history on file for this patient.   Physical Exam: Vitals:   06/14/16 1433  BP: 121/74  Pulse: 90  Resp: 16  Temp: 98.7 F (37.1 C)  TempSrc: Oral  SpO2: 98%  Weight: 233 lb 6.4 oz (105.9 kg)  Height:  (1.575 m)   Body mass index is 42.69 kg/m.  General- elderly female, Morbidly obese, in no acute distress Head- normocephalic, atraumatic Nose- no nasal discharge Throat- moist mucus membrane, normal oropharynx  Eyes- PERRLA, EOMI, no pallor, no icterus, no discharge, normal conjunctiva, normal sclera Neck- no cervical lymphadenopathy Cardiovascular- normal s1,s2, no murmur Respiratory- bilateral clear to auscultation, no wheeze, no rhonchi, no crackles, no use of accessory muscles Abdomen- bowel sounds present, soft, non tender, no guarding or rigidity, no CVA tenderness Musculoskeletal- able to move all 4 extremities, generalized weakness, trace left leg edema, good dorsalis pedis pulses  Neurological- alert and oriented to person, place and time Skin- warm and dry, left leg erythema has decreased compared to the marked area on the leg, warmth present on touch, non-tender Psychiatry- normal mood and affect    Labs reviewed: Basic Metabolic Panel:  Recent Labs  63/84/66 0320 06/09/16 0509 06/11/16 0459 06/13/16 0511  NA 134* 139 136 135  K 3.6 3.5 3.8 3.8  CL 99* 99* 95* 100*  CO2 GLUCOSE 88 95 102* 133*  BUN CREATININE 0.71 0.64 0.72 0.76  CALCIUM 7.9* 8.1* 8.7* 8.5*  MG 1.8  --   --   --    Liver Function Tests:  Recent Labs  06/04/16 0131 06/06/16 0352  AST 34 28  ALT 31 24  ALKPHOS 73 57  BILITOT 1.1 2.0*  PROT 8.3* 6.0*  ALBUMIN 3.9 2.4*    No results for input(s): LIPASE, AMYLASE in the last 8760 hours. No results for input(s): AMMONIA in the last 8760 hours. CBC:  Recent Labs  06/04/16 0131  06/06/16 0352  06/11/16 0459 06/12/16 0557 06/13/16 0511  WBC 14.3*  < > 22.5*  < > 24.4* 18.5* 12.6*  NEUTROABS 13.0*  --  20.1*  --   --   --   --   HGB 12.7  < > 9.6*  < > 11.1* 9.1* 9.2*  HCT 38.9  < > 29.4*  < > 34.7* 28.9* 28.8*  MCV 86.6  < > 85.5  < > 87.6 85.8 88.1  PLT 254  < > 205  < > 407* 432* 453*  < > = values in this interval not displayed. Cardiac Enzymes:  Recent Labs  06/05/16 1235 06/06/16 0352 06/07/16 0315  CKTOTAL 451* 293* 148   BNP: Invalid input(s): POCBNP CBG: No results for input(s): GLUCAP in the last 8760 hours.  Radiological Exams: Dg Chest 2 View  Result Date:  06/04/2016 CLINICAL DATA:  Fever EXAM: CHEST  2 VIEW COMPARISON:  chest radiograph 02/04/2013. FINDINGS: The heart size and mediastinal contours are within normal limits. Both lungs are clear. The visualized skeletal structures are unremarkable. IMPRESSION: No active cardiopulmonary disease. Electronically Signed   By: Deatra Robinson M.D.   On: 06/04/2016 02:50   Ct Angio Chest Pe W Or Wo Contrast  Result Date: 06/04/2016 CLINICAL DATA:  Positive D-dimer, recent trauma 2 left leg, constant left leg and calf pain starting yesterday EXAM: CT ANGIOGRAPHY CHEST WITH CONTRAST TECHNIQUE: Multidetector CT imaging of the chest was performed using the standard protocol during bolus administration of intravenous contrast. Multiplanar CT image reconstructions and MIPs were obtained to evaluate the vascular anatomy. CONTRAST:  100 cc Isovue COMPARISON:  None. FINDINGS: Cardiovascular: Cardiac size is within normal limits. No pericardial effusion. No aortic aneurysm. No pulmonary embolus is noted. There are artifacts from patient's large body habitus. Mediastinum/Nodes: No mediastinal hematoma or adenopathy. No hilar adenopathy is noted. Central  airways are patent. Small hiatal hernia. Lungs/Pleura: Images of the lung parenchyma shows no infiltrate or pulmonary edema. Mild atelectasis noted bilateral lower lobe posteriorly. Upper Abdomen: The visualized upper abdomen is unremarkable. Musculoskeletal: No destructive bony lesions are noted. Sagittal images of the spine shows mild degenerative changes mid and lower thoracic spine. Sagittal view of the sternum is unremarkable. Review of the MIP images confirms the above findings. IMPRESSION: 1. No definite pulmonary embolus is noted. There are artifacts from patient's large body habitus. 2. No mediastinal hematoma or adenopathy.  Small hiatal hernia. 3. No acute infiltrate or pulmonary edema. Dependent atelectasis noted bilateral lower lobe posteriorly. 4. Degenerative changes mid to lower thoracic spine. Electronically Signed   By: Natasha Mead M.D.   On: 06/04/2016 13:17   Ct Abdomen Pelvis W Contrast  Result Date: 06/06/2016 CLINICAL DATA:  Intermittent right lower quadrant pain, constipation x1 week EXAM: CT ABDOMEN AND PELVIS WITH CONTRAST TECHNIQUE: Multidetector CT imaging of the abdomen and pelvis was performed using the standard protocol following bolus administration of intravenous contrast. CONTRAST:  ISOVUE-300 IOPAMIDOL (ISOVUE-300) INJECTION 61% COMPARISON:  Chest radiographs dated 06/06/2016. CTA chest dated 06/04/2016. FINDINGS: Lower chest: Multifocal patchy opacities, right lower lobe predominant, suspicious for pneumonia. Small bilateral pleural effusions, right greater than left. Hepatobiliary: Liver is within normal limits. Gallbladder is unremarkable. No intrahepatic or extrahepatic ductal dilatation. Pancreas: Within normal limits. Spleen: Within normal limits. Adrenals/Urinary Tract: Adrenal glands are within normal limits. Kidneys are within normal limits.  No hydronephrosis. Bladder is within normal limits Stomach/Bowel: Stomach is within normal limits. No evidence of bowel  obstruction. Normal appendix (series 2/ image 66). Vascular/Lymphatic: No evidence of abdominal aortic aneurysm. No suspicious abdominopelvic lymphadenopathy. Reproductive: Uterus is within normal limits. Bilateral ovaries are within normal limits. Other: No abdominopelvic ascites. Musculoskeletal: Mild degenerative changes of the lower thoracic spine. IMPRESSION: No evidence of bowel obstruction.  Normal appendix. No CT findings to account for the patient's right lower quadrant abdominal pain. Multifocal patchy opacities, right lower lobe predominant, suspicious for pneumonia. Small bilateral pleural effusions, right greater than left. Electronically Signed   By: Charline Bills M.D.   On: 06/06/2016 14:27   Mr Tibia Fibula Left W Wo Contrast  Result Date: 06/05/2016 CLINICAL DATA:  Left leg and calf pain onset yesterday. Fibromyalgia. EXAM: MRI OF LOWER LEFT EXTREMITY WITHOUT AND WITH CONTRAST TECHNIQUE: Multiplanar, multisequence MR imaging of the left calf was performed both before and after administration of intravenous contrast. CONTRAST:  20mL MULTIHANCE GADOBENATE DIMEGLUMINE 529 MG/ML IV SOLN COMPARISON:  06/04/2016 knee radiographs FINDINGS: Bones/Joint/Cartilage Small to moderate left knee effusion. There is only mild associated synovial enhancement, and accordingly I am skeptical of septic joint. Ligaments Today's imaging was performed with very large field of view and is not suitable for assessing small ligamentous structures. Muscles, Tendons, and soft tissues Abnormal edema tracking along the gastrocnemius muscle margins (especially medially) and soleus muscle from the knee down to the distal tibial level, with involvement along both the superficial fascia planes and also along deep fascia planes between the muscles, with low-level associated enhancement along the fascia planes. No gas is tracking in the soft tissues. There is potentially some very subtle accentuated edema signal in the soleus  and medial head gastrocnemius muscles, in a faint patchy distribution. Subcutaneous edema also tracks along the superficial fascia margin medially, anteriorly, and posteriorly in the calf. Anterior compartment does not appear involved. There is some expansion of the subcutaneous space on the left medial calf compared to the right. There appears to be some edema in the left distal thigh tracking around the iliotibial tract and biceps femoris, image 1/6. IMPRESSION: 1. Abnormal edema tracking along and to a lesser extent within the medial head gastrocnemius, soleus, and lateral head gastrocnemius in the calf, with low-level associated enhancement. No definite gas in the soft tissues. However, this may reflect early fasciitis with low-level myositis. There is also some edema tracking along the distal iliotibial tract and distal biceps femoris musculotendinous junction in the knee. No anterior compartmental involvement. 2. Small knee effusion. I am skeptical of septic joint given the minimal degree of synovial enhancement. 3. Subcutaneous edema and low-level enhancement especially medially in the calf potentially from cellulitis. Electronically Signed   By: Gaylyn Rong M.D.   On: 06/05/2016 11:25   Dg Chest Port 1 View  Result Date: 06/06/2016 CLINICAL DATA:  51 year old female with dyspnea. Hypertension. Sepsis. Subsequent encounter. EXAM: PORTABLE CHEST 1 VIEW COMPARISON:  06/04/2016. FINDINGS: Marked change from prior examination with prominent asymmetric airspace disease greater on the right. This may represent asymmetric pulmonary edema although infectious infiltrate cannot be excluded in proper clinical setting. No pneumothorax. Cardiomegaly. Gas distended bowel. IMPRESSION: Marked change from prior examination with prominent asymmetric airspace disease greater on the right. This may represent asymmetric pulmonary edema although infectious infiltrate cannot be excluded in proper clinical setting.  Cardiomegaly. These results will be called to the ordering clinician or representative by the Radiologist Assistant, and communication documented in the PACS or zVision Dashboard. Electronically Signed   By: Lacy Duverney M.D.   On: 06/06/2016 11:36   Dg Knee Complete 4 Views Left  Result Date: 06/04/2016 CLINICAL DATA:  Knee pain EXAM: LEFT KNEE - COMPLETE 4+ VIEW COMPARISON:  None. FINDINGS: No evidence of fracture, dislocation, or joint effusion. No evidence of arthropathy or other focal bone abnormality. Soft tissues are unremarkable. IMPRESSION: No fracture, dislocation or arthropathy of the left knee. Electronically Signed   By: Deatra Robinson M.D.   On: 06/04/2016 02:51    Assessment/Plan  Generalized weakness From physical deconditioning. Will need for patient to work with physical therapy and occupational therapy to help regain and restore her strength and balance. Fall precautions to be taken.  Left lower extremity cellulitis Continue doxycycline 100 mg twice a day until 06/19/2016 and Keflex 500 mg 4 times a day until 06/19/2016. Monitor clinically. Monitor WBC  Leukocytosis From left lower extremity cellulitis. Currently afebrile. Monitor WBC and temperature  curve.  Anemia of chronic disease Monitor CBC periodically  Fibromyalgia Continue amitriptyline 50 mg daily, gabapentin 300 mg twice a day, tramadol 100 mg every 6 hours as needed with diclofenac sodium 75 mg twice a day as needed for pain. Get PMR consult. Discontinue ibuprofen.   hypertension Continue lisinopril and hydrochlorothiazide current regimen, monitor BMP and blood pressure reading   Goals of care: short term rehabilitation   Labs/tests ordered: cbc, cmp 06/20/16  Family/ staff Communication: reviewed care plan with patient and nursing supervisor  I have spent greater than 50 minutes for this encounter which includes reviewing hospital records, addressing above mentioned concerns, reviewing care plan with  patient, answering patient's concerns and counseling her.     Oneal Grout, MD Internal Medicine [facility] [address] [place], [place] [address] Cell Phone (Monday-Friday 8 am - 5 pm): [phone] On Call: [phone] and follow prompts after 5 pm and on weekends Office Phone: [phone] Office Fax: [phone]

## 2016-06-20 LAB — CBC AND DIFFERENTIAL
HCT: 37 % (ref 36–46)
HEMOGLOBIN: 11.1 g/dL — AB (ref 12.0–16.0)
Neutrophils Absolute: 3 /uL
Platelets: 330 10*3/uL (ref 150–399)
WBC: 6.3 10^3/mL

## 2016-06-20 LAB — BASIC METABOLIC PANEL
BUN: 12 mg/dL (ref 4–21)
Creatinine: 0.9 mg/dL (ref 0.5–1.1)
GLUCOSE: 74 mg/dL
SODIUM: 136 mmol/L — AB (ref 137–147)

## 2016-06-20 LAB — HEPATIC FUNCTION PANEL
ALK PHOS: 130 U/L — AB (ref 25–125)
BILIRUBIN, TOTAL: 0.6 mg/dL

## 2016-06-22 ENCOUNTER — Encounter: Payer: Self-pay | Admitting: Adult Health

## 2016-06-22 ENCOUNTER — Non-Acute Institutional Stay (SKILLED_NURSING_FACILITY): Payer: Medicare HMO | Admitting: Adult Health

## 2016-06-22 DIAGNOSIS — I10 Essential (primary) hypertension: Secondary | ICD-10-CM

## 2016-06-22 DIAGNOSIS — R531 Weakness: Secondary | ICD-10-CM | POA: Diagnosis not present

## 2016-06-22 DIAGNOSIS — M797 Fibromyalgia: Secondary | ICD-10-CM | POA: Diagnosis not present

## 2016-06-22 DIAGNOSIS — D638 Anemia in other chronic diseases classified elsewhere: Secondary | ICD-10-CM

## 2016-06-22 DIAGNOSIS — L03116 Cellulitis of left lower limb: Secondary | ICD-10-CM

## 2016-06-22 NOTE — Progress Notes (Signed)
DATE:  06/22/2016   MRN:  [id number]  BIRTHDAY: 1965-07-03  Facility:  Nursing Home Location:  [facility]  Nursing Home Room Number: [id number]  LEVEL OF CARE:  SNF [phone])  Contact Information    Name Relation Home Work Mobile   [place] Daughter [phone]     [place], Sachs [phone]         Code Status History    Date Active Date Inactive Code Status Order ID Comments User Context   06/04/2016 12:00 PM 06/13/2016  9:03 PM Full Code [id number]  Alba Cory, MD ED       Chief Complaint  Patient presents with  . Discharge Note    HISTORY OF PRESENT ILLNESS:  This is a 51-YO female seen for discharge.  She will discharge home on 06/24/2016 with [facility] for PT and OT.  She has been admitted to [facility] on 06/13/16 from [facility] admission dates 06/03/16 thru 06/13/16 with fever and chills and pain to left lower extremity. She was started on IV antibiotics and IV fluids. She was treated for LLE cellulitis. She had acute respiratory failure with hypoxia and CT angiogram of the chest was negative for pulmonary embolism. She had pulmonary edema from IV fluids and required IV diuresis.  Infectious disease was consulted and ordered Ceftaroline. She has pmh of hypertension and fibromyalgia.  Patient was admitted to this facility for short-term rehabilitation after the patient's recent hospitalization.  Patient has completed SNF rehabilitation and therapy has cleared the patient for discharge.   PAST MEDICAL HISTORY:  Past Medical History:  Diagnosis Date  . Acute CHF (congestive heart failure) (HCC)   . Blind   . Cellulitis    Leg  . Fasciitis   . Fibromyalgia   . Hypertension   . Hypokalemia   . Sepsis (HCC)      CURRENT MEDICATIONS: Reviewed  Patient's Medications  New Prescriptions   No medications on file  Previous Medications   ACETAMINOPHEN (TYLENOL) 325 MG TABLET    Take 650 mg by mouth 3  (three) times daily. 6AM, 2PM, 10PM   AMITRIPTYLINE (ELAVIL) 50 MG TABLET    Take 50 mg by mouth daily.   CETIRIZINE (ZYRTEC) 10 MG TABLET    Take 10 mg by mouth daily.    DICLOFENAC (VOLTAREN) 75 MG EC TABLET    Take 1 tablet (75 mg total) by mouth 2 (two) times daily as needed for mild pain or moderate pain.   GABAPENTIN (NEURONTIN) 300 MG CAPSULE    Take 300 mg by mouth 2 (two) times daily.    IBUPROFEN (ADVIL,MOTRIN) 600 MG TABLET    TAKE 1 TABLET BY MOUTH EVERY 6 HOURS AS NEEDED   LISINOPRIL-HYDROCHLOROTHIAZIDE (PRINZIDE,ZESTORETIC) 20-12.5 MG TABLET    Take 1 tablet by mouth at bedtime.   TRAMADOL (ULTRAM) 50 MG TABLET    Take 100 mg by mouth. Two tabs to = 100 mg at 8 AM, two tabs to = 100 mg q8h prn moderate to severe pain - hold for sedation.  Modified Medications   No medications on file  Discontinued Medications   CEPHALEXIN (KEFLEX) 500 MG CAPSULE    Take 1 capsule (500 mg total) by mouth 4 (four) times daily.   DOXYCYCLINE (VIBRAMYCIN) 100 MG CAPSULE    Take 1 capsule (100 mg total) by mouth 2 (two) times daily.   TRAMADOL (ULTRAM) 50 MG TABLET    Take 2 tablets (100 mg  total) by mouth every 6 (six) hours as needed for moderate pain.     Allergies  Allergen Reactions  . Percocet [Oxycodone-Acetaminophen] Nausea Only    Tolerated with food  . Vicodin [Hydrocodone-Acetaminophen] Nausea Only    Tolerated with food     REVIEW OF SYSTEMS:  GENERAL: no change in appetite, no fatigue, no weight changes, no fever, chills or weakness EYES: Denies dry eyes, eye pain, itching or discharge EARS: Denies change in hearing, ringing in ears, or earache NOSE: Denies nasal congestion or epistaxis MOUTH and THROAT: Denies oral discomfort, gingival pain or bleeding, pain from teeth or hoarseness   RESPIRATORY: no cough, SOB, DOE, wheezing, hemoptysis CARDIAC: no chest pain or palpitations GI: no abdominal pain, diarrhea, constipation, heart burn, nausea or vomiting GU: Denies dysuria,  frequency, hematuria, incontinence, or discharge PSYCHIATRIC: Denies feeling of depression or anxiety. No report of hallucinations, insomnia, paranoia, or agitation     PHYSICAL EXAMINATION  GENERAL APPEARANCE: Well nourished. In no acute distress. Morbidly obese SKIN:  LLE is dry and scaly, no erythema.  HEAD: Normal in size and contour. No evidence of trauma EYES: Lids open and close normally. 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 Patient hears normal voice tunes of the examiner MOUTH and THROAT: Lips are without lesions. Oral mucosa is moist and without lesions. Tongue is normal in shape, size, and color and without lesions NECK: supple, trachea midline, no neck masses, no thyroid tenderness, no thyromegaly LYMPHATICS: no LAN in the neck, no supraclavicular LAN RESPIRATORY: breathing is even & unlabored, BS CTAB CARDIAC: RRR, no murmur,no extra heart sounds, LLE 2+ edema GI: abdomen soft, normal BS, no masses, no tenderness, no hepatomegaly, no splenomegaly EXTREMITIES:  Able to move X 4 extremities, ambulatory PSYCHIATRIC: Alert and oriented X 3. Affect and behavior are appropriate   LABS/RADIOLOGY: Labs reviewed: 06/20/16 WBC 6.3 hemoglobin 11.1 hematocrit 37.2 MCV 93.7 platelet 330 sodium 136 glucose 74 BUN 12 creatinine 0.85 calcium 9.4 total protein 8.7 albumin 3.62 total bilirubin 0.63 alkaline phosphatase 113 GFR >60 Basic Metabolic Panel:  Recent Labs  16/10/96 0320 06/09/16 0509 06/11/16 0459 06/13/16 0511  NA 134* 139 136 135  K 3.6 3.5 3.8 3.8  CL 99* 99* 95* 100*  CO2 GLUCOSE 88 95 102* 133*  BUN CREATININE 0.71 0.64 0.72 0.76  CALCIUM 7.9* 8.1* 8.7* 8.5*  MG 1.8  --   --   --    Liver Function Tests:  Recent Labs  06/04/16 0131 06/06/16 0352  AST 34 28  ALT 31 24  ALKPHOS 73 57  BILITOT 1.1 2.0*  PROT 8.3* 6.0*  ALBUMIN 3.9 2.4*   CBC:  Recent Labs  06/04/16 0131  06/06/16 0352  06/11/16 0459 06/12/16 0557  06/13/16 0511  WBC 14.3*  < > 22.5*  < > 24.4* 18.5* 12.6*  NEUTROABS 13.0*  --  20.1*  --   --   --   --   HGB 12.7  < > 9.6*  < > 11.1* 9.1* 9.2*  HCT 38.9  < > 29.4*  < > 34.7* 28.9* 28.8*  MCV 86.6  < > 85.5  < > 87.6 85.8 88.1  PLT 254  < > 205  < > 407* 432* 453*  < > = values in this interval not displayed. Cardiac Enzymes:  Recent Labs  06/05/16 1235 06/06/16 0352 06/07/16 0315  CKTOTAL 451* 293* 148    Dg Chest  2 View  Result Date: 06/04/2016 CLINICAL DATA:  Fever EXAM: CHEST  2 VIEW COMPARISON:  chest radiograph 02/04/2013. FINDINGS: The heart size and mediastinal contours are within normal limits. Both lungs are clear. The visualized skeletal structures are unremarkable. IMPRESSION: No active cardiopulmonary disease. Electronically Signed   By: Deatra Robinson M.D.   On: 06/04/2016 02:50   Ct Angio Chest Pe W Or Wo Contrast  Result Date: 06/04/2016 CLINICAL DATA:  Positive D-dimer, recent trauma 2 left leg, constant left leg and calf pain starting yesterday EXAM: CT ANGIOGRAPHY CHEST WITH CONTRAST TECHNIQUE: Multidetector CT imaging of the chest was performed using the standard protocol during bolus administration of intravenous contrast. Multiplanar CT image reconstructions and MIPs were obtained to evaluate the vascular anatomy. CONTRAST:  100 cc Isovue COMPARISON:  None. FINDINGS: Cardiovascular: Cardiac size is within normal limits. No pericardial effusion. No aortic aneurysm. No pulmonary embolus is noted. There are artifacts from patient's large body habitus. Mediastinum/Nodes: No mediastinal hematoma or adenopathy. No hilar adenopathy is noted. Central airways are patent. Small hiatal hernia. Lungs/Pleura: Images of the lung parenchyma shows no infiltrate or pulmonary edema. Mild atelectasis noted bilateral lower lobe posteriorly. Upper Abdomen: The visualized upper abdomen is unremarkable. Musculoskeletal: No destructive bony lesions are noted. Sagittal images of the spine  shows mild degenerative changes mid and lower thoracic spine. Sagittal view of the sternum is unremarkable. Review of the MIP images confirms the above findings. IMPRESSION: 1. No definite pulmonary embolus is noted. There are artifacts from patient's large body habitus. 2. No mediastinal hematoma or adenopathy.  Small hiatal hernia. 3. No acute infiltrate or pulmonary edema. Dependent atelectasis noted bilateral lower lobe posteriorly. 4. Degenerative changes mid to lower thoracic spine. Electronically Signed   By: Natasha Mead M.D.   On: 06/04/2016 13:17   Ct Abdomen Pelvis W Contrast  Result Date: 06/06/2016 CLINICAL DATA:  Intermittent right lower quadrant pain, constipation x1 week EXAM: CT ABDOMEN AND PELVIS WITH CONTRAST TECHNIQUE: Multidetector CT imaging of the abdomen and pelvis was performed using the standard protocol following bolus administration of intravenous contrast. CONTRAST:  ISOVUE-300 IOPAMIDOL (ISOVUE-300) INJECTION 61% COMPARISON:  Chest radiographs dated 06/06/2016. CTA chest dated 06/04/2016. FINDINGS: Lower chest: Multifocal patchy opacities, right lower lobe predominant, suspicious for pneumonia. Small bilateral pleural effusions, right greater than left. Hepatobiliary: Liver is within normal limits. Gallbladder is unremarkable. No intrahepatic or extrahepatic ductal dilatation. Pancreas: Within normal limits. Spleen: Within normal limits. Adrenals/Urinary Tract: Adrenal glands are within normal limits. Kidneys are within normal limits.  No hydronephrosis. Bladder is within normal limits Stomach/Bowel: Stomach is within normal limits. No evidence of bowel obstruction. Normal appendix (series 2/ image 66). Vascular/Lymphatic: No evidence of abdominal aortic aneurysm. No suspicious abdominopelvic lymphadenopathy. Reproductive: Uterus is within normal limits. Bilateral ovaries are within normal limits. Other: No abdominopelvic ascites. Musculoskeletal: Mild degenerative changes of  the lower thoracic spine. IMPRESSION: No evidence of bowel obstruction.  Normal appendix. No CT findings to account for the patient's right lower quadrant abdominal pain. Multifocal patchy opacities, right lower lobe predominant, suspicious for pneumonia. Small bilateral pleural effusions, right greater than left. Electronically Signed   By: Charline Bills M.D.   On: 06/06/2016 14:27   Mr Tibia Fibula Left W Wo Contrast  Result Date: 06/05/2016 CLINICAL DATA:  Left leg and calf pain onset yesterday. Fibromyalgia. EXAM: MRI OF LOWER LEFT EXTREMITY WITHOUT AND WITH CONTRAST TECHNIQUE: Multiplanar, multisequence MR imaging of the left calf was performed both before and after  administration of intravenous contrast. CONTRAST:  20mL MULTIHANCE GADOBENATE DIMEGLUMINE 529 MG/ML IV SOLN COMPARISON:  06/04/2016 knee radiographs FINDINGS: Bones/Joint/Cartilage Small to moderate left knee effusion. There is only mild associated synovial enhancement, and accordingly I am skeptical of septic joint. Ligaments Today's imaging was performed with very large field of view and is not suitable for assessing small ligamentous structures. Muscles, Tendons, and soft tissues Abnormal edema tracking along the gastrocnemius muscle margins (especially medially) and soleus muscle from the knee down to the distal tibial level, with involvement along both the superficial fascia planes and also along deep fascia planes between the muscles, with low-level associated enhancement along the fascia planes. No gas is tracking in the soft tissues. There is potentially some very subtle accentuated edema signal in the soleus and medial head gastrocnemius muscles, in a faint patchy distribution. Subcutaneous edema also tracks along the superficial fascia margin medially, anteriorly, and posteriorly in the calf. Anterior compartment does not appear involved. There is some expansion of the subcutaneous space on the left medial calf compared to the  right. There appears to be some edema in the left distal thigh tracking around the iliotibial tract and biceps femoris, image 1/6. IMPRESSION: 1. Abnormal edema tracking along and to a lesser extent within the medial head gastrocnemius, soleus, and lateral head gastrocnemius in the calf, with low-level associated enhancement. No definite gas in the soft tissues. However, this may reflect early fasciitis with low-level myositis. There is also some edema tracking along the distal iliotibial tract and distal biceps femoris musculotendinous junction in the knee. No anterior compartmental involvement. 2. Small knee effusion. I am skeptical of septic joint given the minimal degree of synovial enhancement. 3. Subcutaneous edema and low-level enhancement especially medially in the calf potentially from cellulitis. Electronically Signed   By: Gaylyn Rong M.D.   On: 06/05/2016 11:25   Dg Chest Port 1 View  Result Date: 06/06/2016 CLINICAL DATA:  51 year old female with dyspnea. Hypertension. Sepsis. Subsequent encounter. EXAM: PORTABLE CHEST 1 VIEW COMPARISON:  06/04/2016. FINDINGS: Marked change from prior examination with prominent asymmetric airspace disease greater on the right. This may represent asymmetric pulmonary edema although infectious infiltrate cannot be excluded in proper clinical setting. No pneumothorax. Cardiomegaly. Gas distended bowel. IMPRESSION: Marked change from prior examination with prominent asymmetric airspace disease greater on the right. This may represent asymmetric pulmonary edema although infectious infiltrate cannot be excluded in proper clinical setting. Cardiomegaly. These results will be called to the ordering clinician or representative by the Radiologist Assistant, and communication documented in the PACS or zVision Dashboard. Electronically Signed   By: Lacy Duverney M.D.   On: 06/06/2016 11:36   Dg Knee Complete 4 Views Left  Result Date: 06/04/2016 CLINICAL DATA:  Knee  pain EXAM: LEFT KNEE - COMPLETE 4+ VIEW COMPARISON:  None. FINDINGS: No evidence of fracture, dislocation, or joint effusion. No evidence of arthropathy or other focal bone abnormality. Soft tissues are unremarkable. IMPRESSION: No fracture, dislocation or arthropathy of the left knee. Electronically Signed   By: Deatra Robinson M.D.   On: 06/04/2016 02:51    ASSESSMENT/PLAN:  Generalized weakness - for home health PT and OT, for therapeutic strengthening exercises; fall precautions  Left lower extremity cellulitis - recently completed doxycycline and Keflex course, ended 06/19/16, instructed patient to monitor for signs of infection I.e. redness, drainage,etc  Anemia of chronic disease - hgb 11.1, stable  Fibromyalgia - continue gabapentin 300 mg 1 capsule by mouth twice a day, Ultram 50 mg  1-2 tabs  Q 6 hours PRN, amitriptyline 50 mg 1 tab by mouth daily, ibuprofen 600 mg by mouth every 6 hours when necessary   Hypertension - well-controlled; continue lisinopril-HCTZ 20-12.5 mg by mouth every at bedtime     I have filled out patient's discharge paperwork and written prescriptions.  Patient will receive home health PT and OT.  DME provided:  Rolling walker  Total discharge time: Greater than 30 minutes Greater than 50% was spent in counseling and coordination of care.  Discharge time involved coordination of the discharge process with social worker, nursing staff and therapy department. Medical justification for home health services/DME verified.    Mykell Rawl C. Medina-Vargas - NP    [employer] [phone]

## 2016-07-01 ENCOUNTER — Other Ambulatory Visit: Payer: Self-pay | Admitting: Adult Health

## 2016-09-13 ENCOUNTER — Other Ambulatory Visit: Payer: Self-pay | Admitting: Adult Health

## 2016-11-02 ENCOUNTER — Other Ambulatory Visit: Payer: Self-pay | Admitting: Adult Health

## 2016-11-03 ENCOUNTER — Other Ambulatory Visit: Payer: Self-pay | Admitting: Adult Health

## 2016-11-20 ENCOUNTER — Other Ambulatory Visit: Payer: Self-pay | Admitting: Adult Health

## 2017-04-27 ENCOUNTER — Emergency Department (HOSPITAL_COMMUNITY)
Admission: EM | Admit: 2017-04-27 | Discharge: 2017-04-27 | Disposition: A | Discharge status: Home / Self Care | Payer: Medicare Other | Attending: Emergency Medicine | Admitting: Emergency Medicine

## 2017-04-27 ENCOUNTER — Encounter (HOSPITAL_COMMUNITY): Payer: Self-pay | Admitting: Emergency Medicine

## 2017-04-27 DIAGNOSIS — Z886 Allergy status to analgesic agent status: Secondary | ICD-10-CM | POA: Diagnosis not present

## 2017-04-27 DIAGNOSIS — I10 Essential (primary) hypertension: Secondary | ICD-10-CM | POA: Diagnosis not present

## 2017-04-27 DIAGNOSIS — R1084 Generalized abdominal pain: Secondary | ICD-10-CM | POA: Diagnosis present

## 2017-04-27 DIAGNOSIS — Z79899 Other long term (current) drug therapy: Secondary | ICD-10-CM | POA: Diagnosis not present

## 2017-04-27 DIAGNOSIS — R197 Diarrhea, unspecified: Secondary | ICD-10-CM | POA: Insufficient documentation

## 2017-04-27 DIAGNOSIS — I509 Heart failure, unspecified: Secondary | ICD-10-CM | POA: Diagnosis not present

## 2017-04-27 DIAGNOSIS — R112 Nausea with vomiting, unspecified: Secondary | ICD-10-CM | POA: Insufficient documentation

## 2017-04-27 DIAGNOSIS — N390 Urinary tract infection, site not specified: Secondary | ICD-10-CM | POA: Insufficient documentation

## 2017-04-27 LAB — URINALYSIS, ROUTINE W REFLEX MICROSCOPIC
Bilirubin Urine: NEGATIVE
Glucose, UA: NEGATIVE mg/dL
KETONES UR: NEGATIVE mg/dL
NITRITE: NEGATIVE
PH: 6 (ref 5.0–8.0)
Protein, ur: NEGATIVE mg/dL
Specific Gravity, Urine: 1.008 (ref 1.005–1.030)

## 2017-04-27 LAB — CBC
HCT: 38.4 % (ref 36.0–46.0)
Hemoglobin: 12 g/dL (ref 12.0–15.0)
MCH: 28.2 pg (ref 26.0–34.0)
MCHC: 31.3 g/dL (ref 30.0–36.0)
MCV: 90.1 fL (ref 78.0–100.0)
PLATELETS: 213 10*3/uL (ref 150–400)
RBC: 4.26 MIL/uL (ref 3.87–5.11)
RDW: 15.7 % — ABNORMAL HIGH (ref 11.5–15.5)
WBC: 4.6 10*3/uL (ref 4.0–10.5)

## 2017-04-27 LAB — COMPREHENSIVE METABOLIC PANEL
ALK PHOS: 75 U/L (ref 38–126)
ALT: 32 U/L (ref 14–54)
ANION GAP: 10 (ref 5–15)
AST: 42 U/L — ABNORMAL HIGH (ref 15–41)
Albumin: 3.5 g/dL (ref 3.5–5.0)
BILIRUBIN TOTAL: 0.9 mg/dL (ref 0.3–1.2)
BUN: 12 mg/dL (ref 6–20)
CALCIUM: 8.3 mg/dL — AB (ref 8.9–10.3)
CO2: 22 mmol/L (ref 22–32)
CREATININE: 0.76 mg/dL (ref 0.44–1.00)
Chloride: 104 mmol/L (ref 101–111)
Glucose, Bld: 83 mg/dL (ref 65–99)
Potassium: 3.7 mmol/L (ref 3.5–5.1)
Sodium: 136 mmol/L (ref 135–145)
TOTAL PROTEIN: 7.4 g/dL (ref 6.5–8.1)

## 2017-04-27 LAB — PREGNANCY, URINE: Preg Test, Ur: NEGATIVE

## 2017-04-27 LAB — LIPASE, BLOOD: Lipase: 27 U/L (ref 11–51)

## 2017-04-27 MED ORDER — CEPHALEXIN 500 MG PO CAPS: 500.0000 mg | ORAL_CAPSULE | Freq: Four times a day (QID) | ORAL | 0 refills | Status: DC

## 2017-04-27 NOTE — ED Triage Notes (Signed)
patient c/o abd pains with n/v/d for couple days.

## 2017-04-27 NOTE — ED Provider Notes (Signed)
Patient placed in Quick Look pathway, seen and evaluated   Chief Complaint: Abdominal pains (diffuse) N/V/D.   HPI:   Here for diffuse, migratory abdominal pains since Sunday.  Multiple episodes of diarrhea, vomited yesterday.  Daughter has similar illness. Denies nausea currently.   ROS: No fevers or chills Physical Exam:   Gen: No distress  Neuro: Awake and Alert  Skin: Warm    Focused Exam: abdomen is with out focal TTP.    Initiation of care has begun. The patient has been counseled on the process, plan, and necessity for staying for the completion/evaluation, and the remainder of the medical screening examination  Patient evaluated, appears slightly dehydrated.  Sent back to waiting room with instructions to orally hydrate, given large pitcher of water.     Cristina GongHammond, Elizabeth W, PA-C 04/27/17 1554    Bethann BerkshireZammit, Joseph, MD 04/27/17 [phone]

## 2017-04-27 NOTE — ED Provider Notes (Signed)
[facility]-EMERGENCY DEPT Provider Note   CSN: [id number] Arrival date & time: 04/27/17  1232     History   Chief Complaint Chief Complaint  Patient presents with  . Abdominal Pain  . Emesis  . Diarrhea    HPI Kirsten Avery is a 52 y.o. female.  The history is provided by the patient. No language interpreter was used.  Abdominal Pain   This is a new problem. The current episode started more than 2 days ago. The problem occurs constantly. The problem has been gradually improving. The pain is associated with alcohol use. Associated symptoms include diarrhea and vomiting. Nothing aggravates the symptoms. Nothing relieves the symptoms. Past workup does not include GI consult.  Emesis   Associated symptoms include abdominal pain and diarrhea.  Diarrhea   Associated symptoms include abdominal pain and vomiting.  Pt is here with daughter who has the same.  Pt reports she was exposed to a family member on Tuesday who had similar symptoms.   Past Medical History:  Diagnosis Date  . Acute CHF (congestive heart failure) (HCC)   . Blind   . Cellulitis    Leg  . Fasciitis   . Fibromyalgia   . Hypertension   . Hypokalemia   . Sepsis [facility](HCC)     Patient Active Problem List   Diagnosis Date Noted  . Acute pulmonary edema (HCC) 06/07/2016  . Fasciitis 06/06/2016  . Hypokalemia 06/05/2016  . Benign essential HTN 06/05/2016  . Cellulitis, leg   . Fever   . Sepsis (HCC) 06/04/2016  . Nexplanon insertion 09/08/2015  . Blind 09/03/2015  . Well woman exam 09/03/2015    History reviewed. No pertinent surgical history.  OB History    Gravida Para Term Preterm AB Living   7 5 5   2 5    SAB TAB Ectopic Multiple Live Births   2               Home Medications    Prior to Admission medications   Medication Sig Start Date End Date Taking? Authorizing Provider  amitriptyline (ELAVIL) 50 MG tablet Take 50 mg by mouth daily. 05/15/16   [provider]  cephALEXin (KEFLEX) 500 MG capsule Take 1 capsule (500 mg total) by mouth 4 (four) times daily. 04/27/17   Elson AreasSofia, [place]angleo Passage K, PA-C  cetirizine (ZYRTEC) 10 MG tablet Take 10 mg by mouth daily.  04/18/16   [provider]  diclofenac (VOLTAREN) 75 MG EC tablet Take 1 tablet (75 mg total) by mouth 2 (two) times daily as needed for mild pain or moderate pain. 06/13/16   Tyrone NineGrunz, Ryan B, MD  gabapentin (NEURONTIN) 300 MG capsule Take 300 mg by mouth 2 (two) times daily.     [provider]  ibuprofen (ADVIL,MOTRIN) 600 MG tablet TAKE 1 TABLET BY MOUTH EVERY 6 HOURS AS NEEDED 03/18/16   Brock BadHarper, Charles A, MD  lisinopril-hydrochlorothiazide (PRINZIDE,ZESTORETIC) 20-12.5 MG tablet Take 1 tablet by mouth at bedtime. 05/15/16   [provider]  traMADol (ULTRAM) 50 MG tablet Take 100 mg by mouth. Two tabs to = 100 mg at 8 AM, two tabs to = 100 mg q8h prn moderate to severe pain - hold for sedation.    [provider]    Family History No family history on file.  Social History Social History   Tobacco Use  . Smoking status: Never Smoker  . Smokeless tobacco: Never Used  Substance Use Topics  . Alcohol  use: No    Alcohol/week: 0.0 oz  . Drug use: No     Allergies   Percocet [oxycodone-acetaminophen] and Vicodin [hydrocodone-acetaminophen]   Review of Systems Review of Systems  Gastrointestinal: Positive for abdominal pain, diarrhea and vomiting.  All other systems reviewed and are negative.    Physical Exam Updated Vital Signs BP (!) 158/98 (BP Location: Right Arm)   Pulse 96   Temp 98.2 F (36.8 C) (Oral)   Resp 16   SpO2 99%   Physical Exam  Constitutional: She appears well-developed and well-nourished. No distress.  HENT:  Head: Normocephalic and atraumatic.  Eyes: Conjunctivae and EOM are normal. Pupils are equal, round, and reactive to light.  Neck: Neck supple.  Cardiovascular: Normal rate and regular rhythm.  No murmur  heard. Pulmonary/Chest: Effort normal and breath sounds normal. No respiratory distress.  Abdominal: Soft. Bowel sounds are normal. She exhibits no mass. There is no tenderness.  Musculoskeletal: She exhibits no edema.  Neurological: She is alert.  Skin: Skin is warm and dry.  Psychiatric: She has a normal mood and affect.  Nursing note and vitals reviewed.    ED Treatments / Results  Labs (all labs ordered are listed, but only abnormal results are displayed) Labs Reviewed  COMPREHENSIVE METABOLIC PANEL - Abnormal; Notable for the following components:      Result Value   Calcium 8.3 (*)    AST 42 (*)    All other components within normal limits  CBC - Abnormal; Notable for the following components:   RDW 15.7 (*)    All other components within normal limits  URINALYSIS, ROUTINE W REFLEX MICROSCOPIC - Abnormal; Notable for the following components:   APPearance CLOUDY (*)    Hgb urine dipstick SMALL (*)    Leukocytes, UA LARGE (*)    Bacteria, UA FEW (*)    Squamous Epithelial / LPF 6-30 (*)    All other components within normal limits  LIPASE, BLOOD  PREGNANCY, URINE    EKG  EKG Interpretation None       Radiology No results found.  Procedures Procedures (including critical care time)  Medications Ordered in ED Medications - No data to display   Initial Impression / Assessment and Plan / ED Course  I have reviewed the triage vital signs and the nursing notes.  Pertinent labs & imaging results that were available during my care of the patient were reviewed by me and considered in my medical decision making (see chart for details).     MDM  Pt counseled on possible uti.  Pt given rx for keflex.  Pt reports vomiting has resolved.  Pt denies any pain.   Final Clinical Impressions(s) / ED Diagnoses   Final diagnoses:  Urinary tract infection without hematuria, site unspecified  Nausea vomiting and diarrhea    ED Discharge Orders        Ordered     cephALEXin (KEFLEX) 500 MG capsule  4 times daily     04/27/17 1928    An After Visit Summary was printed and given to the patient.    Elson Areas, [place] 04/27/17 Louann Liv, MD 04/27/17 [phone]

## 2017-04-27 NOTE — Discharge Instructions (Signed)
Return if any problems.

## 2017-04-27 NOTE — ED Notes (Signed)
Pt given specimen cup and reminded of need for urine. Pt also given ice water at this time.

## 2017-04-27 NOTE — ED Notes (Signed)
Pt sts she has been off her BP meds for a couple days

## 2017-05-26 ENCOUNTER — Other Ambulatory Visit: Payer: Self-pay | Admitting: Physician Assistant

## 2017-05-26 DIAGNOSIS — Z1231 Encounter for screening mammogram for malignant neoplasm of breast: Secondary | ICD-10-CM

## 2017-06-22 ENCOUNTER — Ambulatory Visit: Payer: Medicare Other

## 2017-10-18 ENCOUNTER — Ambulatory Visit: Payer: Medicare Other | Admitting: Obstetrics and Gynecology

## 2018-04-17 ENCOUNTER — Ambulatory Visit (INDEPENDENT_AMBULATORY_CARE_PROVIDER_SITE_OTHER): Payer: Medicare Other | Admitting: Obstetrics and Gynecology

## 2018-04-17 ENCOUNTER — Other Ambulatory Visit (HOSPITAL_COMMUNITY)
Admission: RE | Admit: 2018-04-17 | Discharge: 2018-04-17 | Disposition: A | Discharge status: Home / Self Care | Payer: Medicare Other | Source: Ambulatory Visit | Attending: Obstetrics and Gynecology | Admitting: Obstetrics and Gynecology

## 2018-04-17 ENCOUNTER — Encounter: Payer: Self-pay | Admitting: Obstetrics and Gynecology

## 2018-04-17 VITALS — BP 128/85 | HR 80 | Ht 63.0 in | Wt 234.0 lb

## 2018-04-17 DIAGNOSIS — Z01419 Encounter for gynecological examination (general) (routine) without abnormal findings: Secondary | ICD-10-CM

## 2018-04-17 LAB — POCT URINALYSIS DIPSTICK
BILIRUBIN UA: NEGATIVE
Glucose, UA: NEGATIVE
KETONES UA: NEGATIVE
NITRITE UA: NEGATIVE
Protein, UA: NEGATIVE
SPEC GRAV UA: 1.02 (ref 1.010–1.025)
UROBILINOGEN UA: 0.2 U/dL
pH, UA: 6 (ref 5.0–8.0)

## 2018-04-17 MED ORDER — PHENYLEPH-SHARK LIV OIL-MO-PET 0.25-3-14-71.9 % RE OINT: 1.0000 "application " | TOPICAL_OINTMENT | Freq: Two times a day (BID) | RECTAL | 0 refills | Status: AC | PRN

## 2018-04-17 NOTE — Addendum Note (Signed)
Addended by: Marya Landry D on: 04/17/2018 04:06 PM   Modules accepted: Orders

## 2018-04-17 NOTE — Progress Notes (Signed)
Pt complaints of urinary leaking and possible rectal wart.

## 2018-04-17 NOTE — Progress Notes (Signed)
Subjective:     Kirsten Avery is a 53 y.o. female P5 with BMI 41 who is here for a comprehensive physical exam. The patient reports some occasional urinary incontinence. She cannot qualify when and how often but states it does not occur with coughing, sneezing and laughing. She expressed concerns regarding a rectal wart. She has a nexplanon in place since 08/2015. She is sexually active without complaints  Past Medical History:  Diagnosis Date  . Acute CHF (congestive heart failure) (HCC)   . Blind   . Cellulitis    Leg  . Fasciitis   . Fibromyalgia   . Hypertension   . Hypokalemia   . Sepsis (HCC)    History reviewed. No pertinent surgical history. History reviewed. No pertinent family history.  Social History   Socioeconomic History  . Marital status: Married    Spouse name: Not on file  . Number of children: Not on file  . Years of education: Not on file  . Highest education level: Not on file  Occupational History  . Not on file  Social Needs  . Financial resource strain: Not on file  . Food insecurity:    Worry: Not on file    Inability: Not on file  . Transportation needs:    Medical: Not on file    Non-medical: Not on file  Tobacco Use  . Smoking status: Never Smoker  . Smokeless tobacco: Never Used  Substance and Sexual Activity  . Alcohol use: No    Alcohol/week: 0.0 standard drinks  . Drug use: No  . Sexual activity: Not Currently    Partners: Male    Birth control/protection: Abstinence  Lifestyle  . Physical activity:    Days per week: Not on file    Minutes per session: Not on file  . Stress: Not on file  Relationships  . Social connections:    Talks on phone: Not on file    Gets together: Not on file    Attends religious service: Not on file    Active member of club or organization: Not on file    Attends meetings of clubs or organizations: Not on file    Relationship status: Not on file  . Intimate partner violence:    Fear of current or  ex partner: Not on file    Emotionally abused: Not on file    Physically abused: Not on file    Forced sexual activity: Not on file  Other Topics Concern  . Not on file  Social History Narrative  . Not on file   Health Maintenance  Topic Date Due  . PAP SMEAR-Modifier  04/21/1987  . COLONOSCOPY  04/20/2016  . MAMMOGRAM  04/15/2017  . INFLUENZA VACCINE  09/21/2017  . TETANUS/TDAP  05/04/2019  . HIV Screening  Completed       Review of Systems Pertinent items are noted in HPI.   Objective:  Blood pressure 128/85, pulse 80, height 5\' 3"  (1.6 m), weight 234 lb (106.1 kg).     GENERAL: Well-developed, well-nourished female in no acute distress.  HEENT: Normocephalic, atraumatic. Sclerae anicteric.  NECK: Supple. Normal thyroid.  LUNGS: Clear to auscultation bilaterally.  HEART: Regular rate and rhythm. BREASTS: Symmetric in size. No palpable masses or lymphadenopathy, skin changes, or nipple drainage. ABDOMEN: Soft, nontender, nondistended. No organomegaly. PELVIC: Normal external female genitalia. Vagina is pink and rugated.  Normal discharge. Normal appearing cervix. Uterus is normal in size. No adnexal mass or tenderness. Small non bleeding  hemorrhoids EXTREMITIES: No cyanosis, clubbing, or edema, 2+ distal pulses.    Assessment:    Healthy female exam.      Plan:    pap smear collected Screening mammogram ordered Urine culture collected Patient referred to urogyn RTC for nexplanon removal in July 2020 See After Visit Summary for Counseling Recommendations

## 2018-04-19 LAB — CERVICOVAGINAL ANCILLARY ONLY
Bacterial vaginitis: NEGATIVE
CANDIDA VAGINITIS: NEGATIVE
CHLAMYDIA, DNA PROBE: NEGATIVE
NEISSERIA GONORRHEA: NEGATIVE
TRICH (WINDOWPATH): NEGATIVE

## 2018-04-20 LAB — URINE CULTURE

## 2018-04-23 MED ORDER — NITROFURANTOIN MONOHYD MACRO 100 MG PO CAPS: 100.0000 mg | ORAL_CAPSULE | Freq: Two times a day (BID) | ORAL | 1 refills | Status: AC

## 2018-04-23 NOTE — Addendum Note (Signed)
Addended by: Catalina Antigua on: 04/23/2018 08:52 AM   Modules accepted: Orders

## 2018-04-24 LAB — CYTOLOGY - PAP
DIAGNOSIS: NEGATIVE
HPV: NOT DETECTED

## 2018-05-18 ENCOUNTER — Ambulatory Visit: Payer: Medicare Other

## 2018-07-31 IMAGING — MR MR [PERSON_NAME] LOW WO/W CM*L*
4 of 10 series · 18 of 40 positions shown · IV contrast (0)
Comparison: 06/04/2016 knee radiographs

CLINICAL DATA: Left leg and calf pain onset yesterday.
Fibromyalgia.

EXAM:
MRI OF LOWER LEFT EXTREMITY WITHOUT AND WITH CONTRAST
TECHNIQUE: Multiplanar, multisequence MR imaging of the left calf was performed
both before and after administration of intravenous contrast.
CONTRAST:  20mL MULTIHANCE GADOBENATE DIMEGLUMINE 529 MG/ML IV SOLN

[Series 4: T1 fat-sat · axial · 5.0mm · 0.94mm/px · z∈[-289,+166]mm · 5 of 62 slices shown]
[im 1/62]
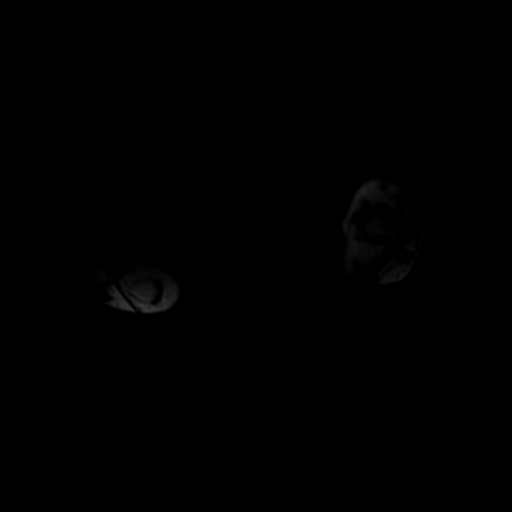
[im 16/62]
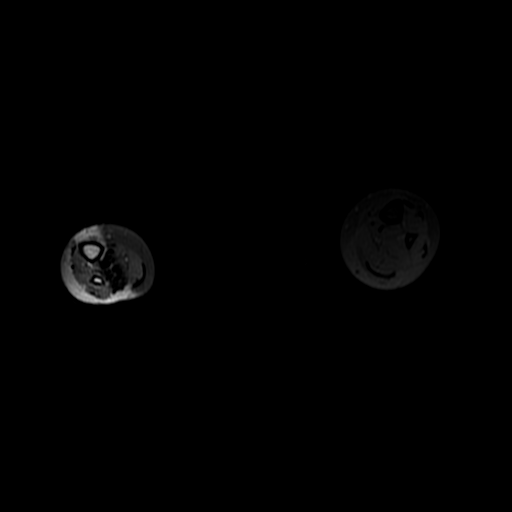
[im 31/62]
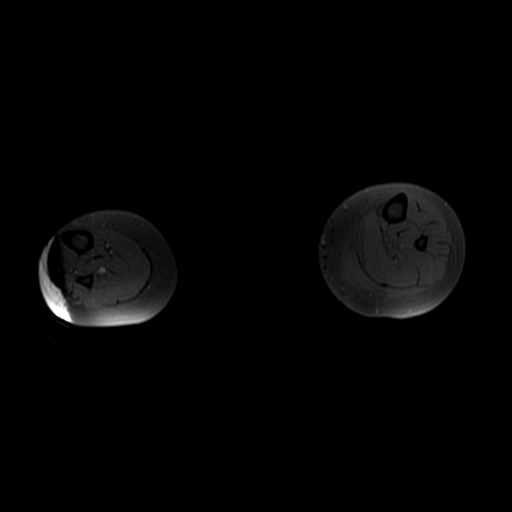
[im 46/62]
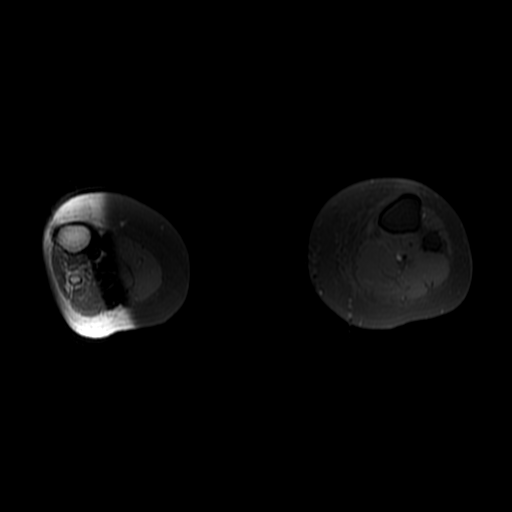
[im 62/62]
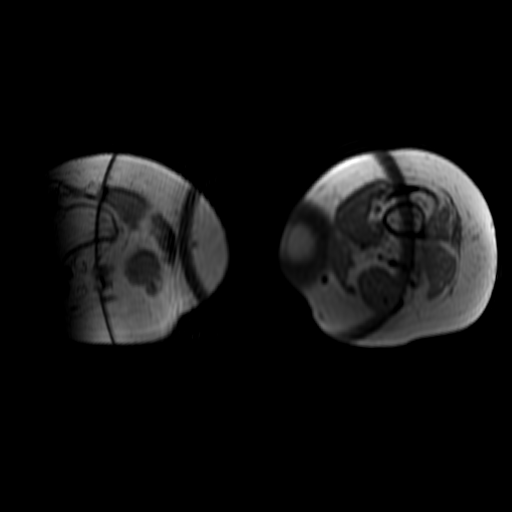

[Series 5: T1 · axial · 5.0mm · 0.94mm/px · z∈[-289,+166]mm · 5 of 62 slices shown]
[im 1/62]
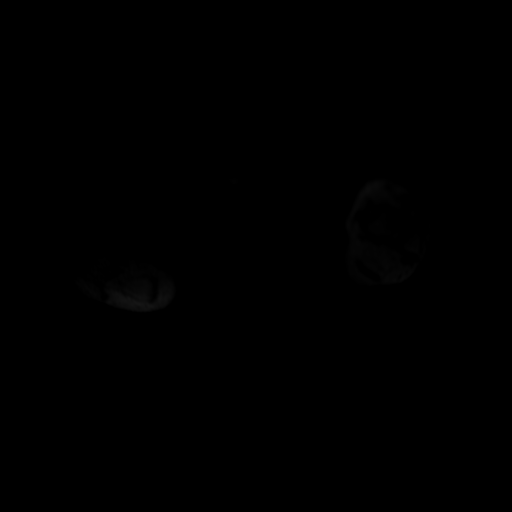
[im 16/62]
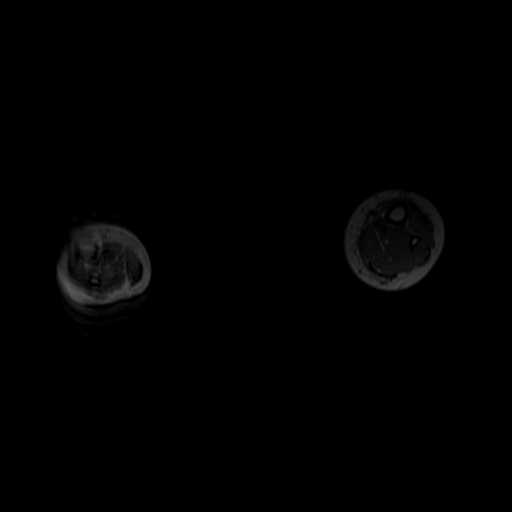
[im 31/62]
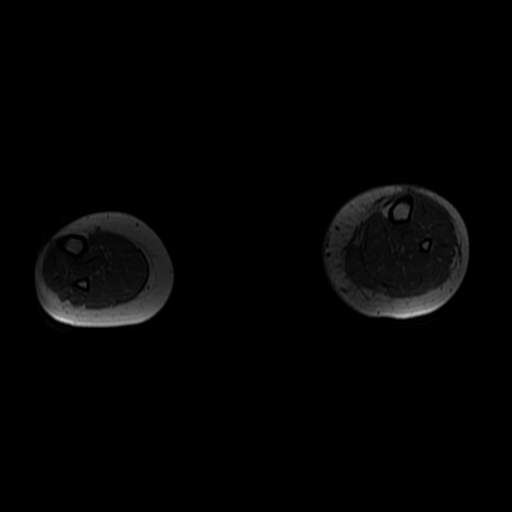
[im 46/62]
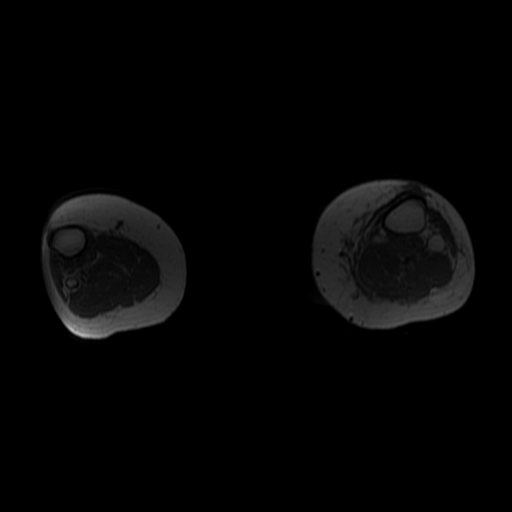
[im 62/62]
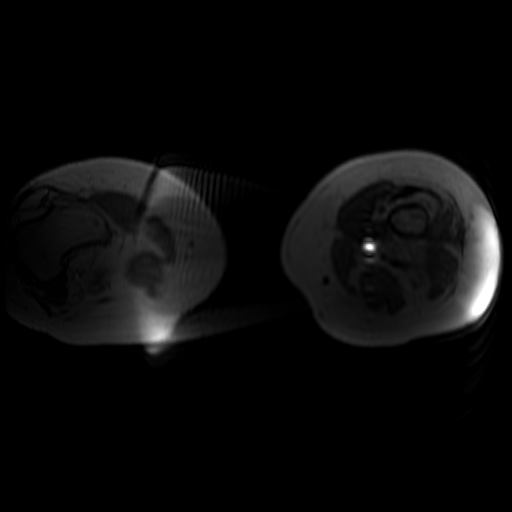

[Series 7: T2 fat-sat · axial · 5.0mm · 0.94mm/px · z∈[-289,+166]mm · 5 of 62 slices shown (1 of 2)]
[im 1/62]
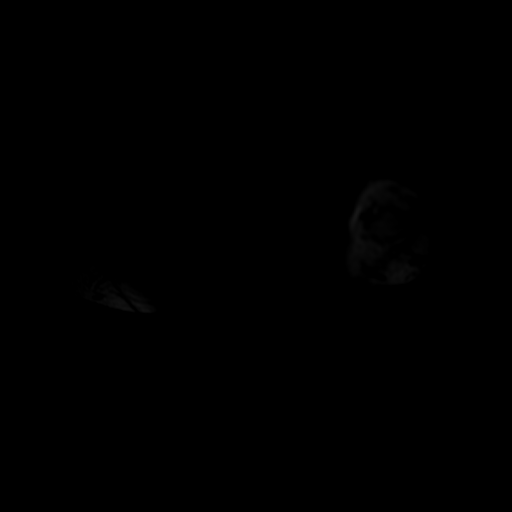
[im 16/62]
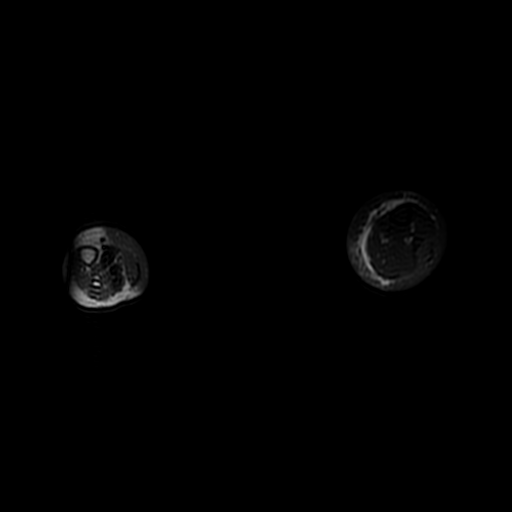
[im 31/62]
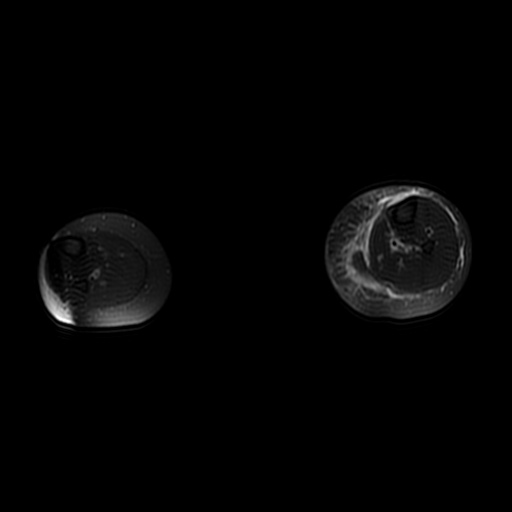
[im 46/62]
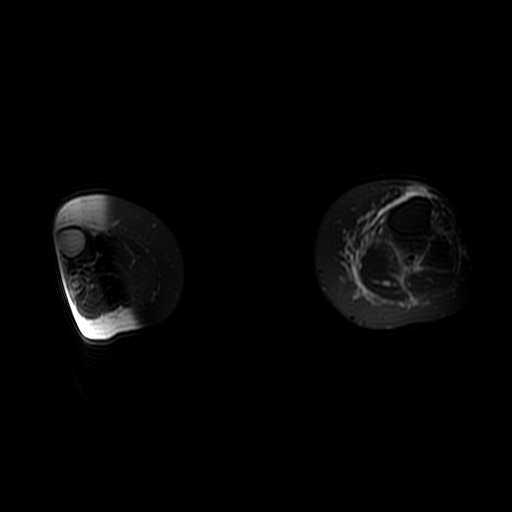
[im 62/62]
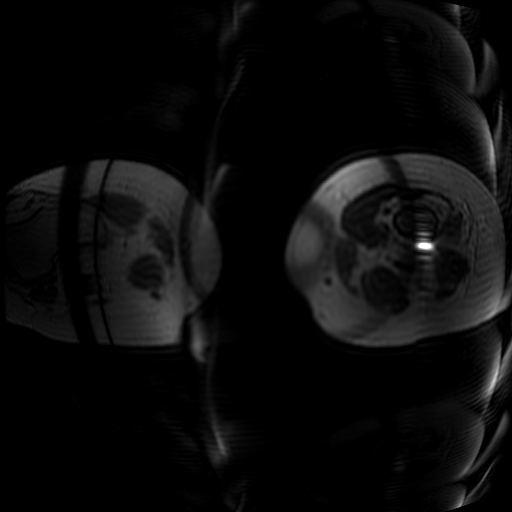

[Series 11: T2 fat-sat · sagittal · 5.0mm · 0.94mm/px · 3 of 34 slices shown (2 of 2)]
[im 1/34]
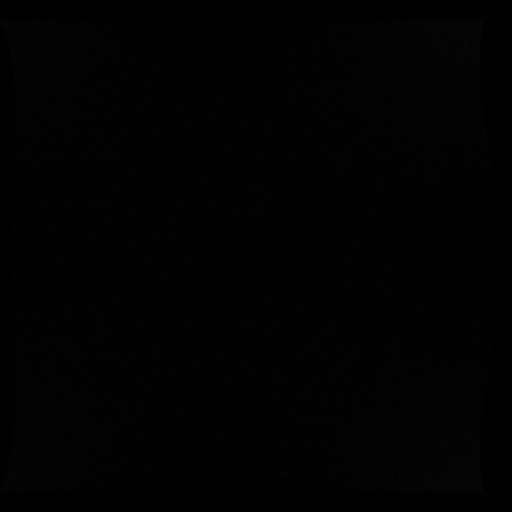
[im 17/34]
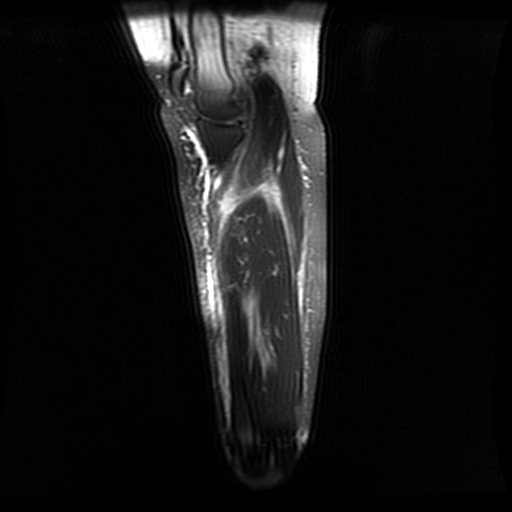
[im 34/34]
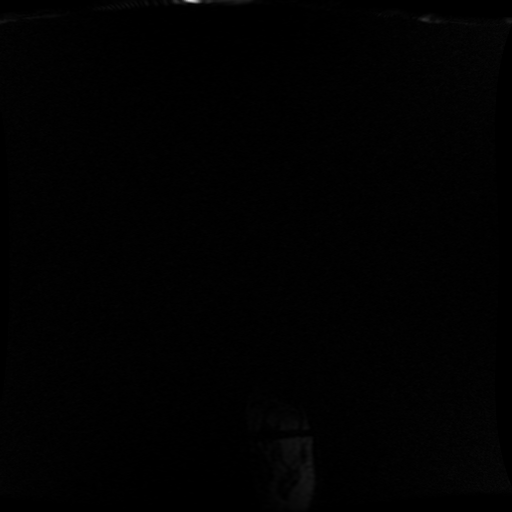

[18 of 40 positions shown; findings below may reference images not displayed]

FINDINGS: Bones/Joint/Cartilage

Small to moderate left knee effusion. There is only mild associated
synovial enhancement, and accordingly I am skeptical of septic
joint.

Ligaments

Today's imaging was performed with very large field of view and is
not suitable for assessing small ligamentous structures.

Muscles, Tendons, and soft tissues

Abnormal edema tracking along the gastrocnemius muscle margins
(especially medially) and soleus muscle from the knee down to the
distal tibial level, with involvement along both the superficial
fascia planes and also along deep fascia planes between the muscles,
with low-level associated enhancement along the fascia planes. No
gas is tracking in the soft tissues. There is potentially some very
subtle accentuated edema signal in the soleus and medial head
gastrocnemius muscles, in a faint patchy distribution.

Subcutaneous edema also tracks along the superficial fascia margin
medially, anteriorly, and posteriorly in the calf. Anterior
compartment does not appear involved. There is some expansion of the
subcutaneous space on the left medial calf compared to the right.

There appears to be some edema in the left distal thigh tracking
around the iliotibial tract and biceps femoris, image [DATE].
IMPRESSION: 1. Abnormal edema tracking along and to a lesser extent within the
medial head gastrocnemius, soleus, and lateral head gastrocnemius in
the calf, with low-level associated enhancement. No definite gas in
the soft tissues. However, this may reflect early fasciitis with
low-level myositis. There is also some edema tracking along the
distal iliotibial tract and distal biceps femoris musculotendinous
junction in the knee. No anterior compartmental involvement.
2. Small knee effusion. I am skeptical of septic joint given the
minimal degree of synovial enhancement.
3. Subcutaneous edema and low-level enhancement especially medially
in the calf potentially from cellulitis.

## 2018-08-06 ENCOUNTER — Ambulatory Visit: Payer: Medicare Other
